# Patient Record
Sex: Female | Born: 1935 | Race: White | Hispanic: No | Smoking: Never smoker
Health system: Southern US, Community
[De-identification: ages and names within clinical notes are randomized; demographics above are authoritative.]

## PROBLEM LIST (undated history)

## (undated) DIAGNOSIS — J449 Chronic obstructive pulmonary disease, unspecified: Secondary | ICD-10-CM

## (undated) DIAGNOSIS — D649 Anemia, unspecified: Secondary | ICD-10-CM

## (undated) DIAGNOSIS — N289 Disorder of kidney and ureter, unspecified: Secondary | ICD-10-CM

## (undated) DIAGNOSIS — C259 Malignant neoplasm of pancreas, unspecified: Secondary | ICD-10-CM

## (undated) DIAGNOSIS — E119 Type 2 diabetes mellitus without complications: Secondary | ICD-10-CM

## (undated) DIAGNOSIS — K219 Gastro-esophageal reflux disease without esophagitis: Secondary | ICD-10-CM

## (undated) DIAGNOSIS — M1990 Unspecified osteoarthritis, unspecified site: Secondary | ICD-10-CM

## (undated) DIAGNOSIS — J45909 Unspecified asthma, uncomplicated: Secondary | ICD-10-CM

## (undated) DIAGNOSIS — I2510 Atherosclerotic heart disease of native coronary artery without angina pectoris: Secondary | ICD-10-CM

## (undated) DIAGNOSIS — I10 Essential (primary) hypertension: Secondary | ICD-10-CM

## (undated) DIAGNOSIS — N189 Chronic kidney disease, unspecified: Secondary | ICD-10-CM

## (undated) HISTORY — PX: CHOLECYSTECTOMY: SHX55

## (undated) HISTORY — PX: ABDOMINAL HYSTERECTOMY: SHX81

## (undated) HISTORY — PX: EYE SURGERY: SHX253

---

## 2004-04-04 ENCOUNTER — Emergency Department: Payer: Self-pay | Admitting: Emergency Medicine

## 2004-05-17 ENCOUNTER — Ambulatory Visit: Payer: Self-pay | Admitting: Gastroenterology

## 2004-10-24 ENCOUNTER — Ambulatory Visit: Payer: Self-pay | Admitting: Family Medicine

## 2006-06-24 ENCOUNTER — Inpatient Hospital Stay: Payer: Self-pay | Admitting: Internal Medicine

## 2007-09-06 ENCOUNTER — Ambulatory Visit: Payer: Self-pay | Admitting: Unknown Physician Specialty

## 2007-10-30 ENCOUNTER — Emergency Department: Payer: Self-pay | Admitting: Emergency Medicine

## 2008-01-29 ENCOUNTER — Ambulatory Visit: Payer: Self-pay | Admitting: Unknown Physician Specialty

## 2008-06-04 ENCOUNTER — Observation Stay: Payer: Self-pay | Admitting: Internal Medicine

## 2009-12-31 ENCOUNTER — Ambulatory Visit: Payer: Self-pay | Admitting: Family Medicine

## 2011-05-08 ENCOUNTER — Emergency Department: Payer: Self-pay | Admitting: *Deleted

## 2011-05-08 LAB — URINALYSIS, COMPLETE
Bilirubin,UR: NEGATIVE
Glucose,UR: NEGATIVE mg/dL (ref 0–75)
Hyaline Cast: 3
Ketone: NEGATIVE
Nitrite: NEGATIVE
Ph: 5 (ref 4.5–8.0)
Protein: NEGATIVE
RBC,UR: 4 /HPF (ref 0–5)
Specific Gravity: 1.015 (ref 1.003–1.030)
WBC UR: 8 /HPF (ref 0–5)

## 2011-05-08 LAB — COMPREHENSIVE METABOLIC PANEL
Albumin: 3.8 g/dL (ref 3.4–5.0)
Alkaline Phosphatase: 79 U/L (ref 50–136)
Anion Gap: 9 (ref 7–16)
BUN: 15 mg/dL (ref 7–18)
Bilirubin,Total: 0.2 mg/dL (ref 0.2–1.0)
EGFR (Non-African Amer.): 52 — ABNORMAL LOW
Glucose: 120 mg/dL — ABNORMAL HIGH (ref 65–99)
Osmolality: 280 (ref 275–301)
SGOT(AST): 23 U/L (ref 15–37)
SGPT (ALT): 21 U/L
Sodium: 139 mmol/L (ref 136–145)

## 2011-05-08 LAB — CBC WITH DIFFERENTIAL/PLATELET
Basophil #: 0.1 10*3/uL (ref 0.0–0.1)
Basophil %: 1.3 %
Eosinophil #: 0.4 10*3/uL (ref 0.0–0.7)
HCT: 32.1 % — ABNORMAL LOW (ref 35.0–47.0)
HGB: 10.4 g/dL — ABNORMAL LOW (ref 12.0–16.0)
MCH: 28.8 pg (ref 26.0–34.0)
MCHC: 32.4 g/dL (ref 32.0–36.0)
MCV: 89 fL (ref 80–100)
Monocyte %: 9.2 %
Neutrophil #: 4.6 10*3/uL (ref 1.4–6.5)
Neutrophil %: 58 %
RDW: 15 % — ABNORMAL HIGH (ref 11.5–14.5)

## 2012-01-02 ENCOUNTER — Emergency Department: Payer: Self-pay | Admitting: Emergency Medicine

## 2012-01-02 LAB — BASIC METABOLIC PANEL
Anion Gap: 8 (ref 7–16)
BUN: 16 mg/dL (ref 7–18)
Creatinine: 0.88 mg/dL (ref 0.60–1.30)
EGFR (African American): >60
EGFR (Non-African Amer.): >60
Glucose: 116 mg/dL — ABNORMAL HIGH (ref 65–99)

## 2012-01-02 LAB — CBC
MCH: 22.7 pg — ABNORMAL LOW (ref 26.0–34.0)
Platelet: 386 10*3/uL (ref 150–440)
WBC: 8.1 10*3/uL (ref 3.6–11.0)

## 2012-01-02 LAB — TROPONIN I: Troponin-I: <0.02 ng/mL

## 2012-02-15 ENCOUNTER — Ambulatory Visit: Payer: Self-pay | Admitting: Gastroenterology

## 2012-04-18 ENCOUNTER — Ambulatory Visit: Payer: Self-pay | Admitting: Gastroenterology

## 2012-06-16 ENCOUNTER — Emergency Department: Payer: Self-pay | Admitting: Emergency Medicine

## 2012-06-16 LAB — CBC
HCT: 25.5 % — ABNORMAL LOW (ref 35.0–47.0)
HGB: 8.2 g/dL — ABNORMAL LOW (ref 12.0–16.0)
MCH: 25.1 pg — ABNORMAL LOW (ref 26.0–34.0)
MCHC: 32 g/dL (ref 32.0–36.0)
MCV: 78 fL — ABNORMAL LOW (ref 80–100)
Platelet: 316 10*3/uL (ref 150–440)

## 2012-06-16 LAB — BASIC METABOLIC PANEL
Anion Gap: 7 (ref 7–16)
Calcium, Total: 8.7 mg/dL (ref 8.5–10.1)
Chloride: 103 mmol/L (ref 98–107)
Co2: 28 mmol/L (ref 21–32)
EGFR (African American): >60
Glucose: 151 mg/dL — ABNORMAL HIGH (ref 65–99)
Osmolality: 280 (ref 275–301)

## 2012-06-16 LAB — TROPONIN I: Troponin-I: <0.02 ng/mL

## 2012-06-16 LAB — CK TOTAL AND CKMB (NOT AT ARMC): CK, Total: 161 U/L (ref 21–215)

## 2013-02-25 ENCOUNTER — Emergency Department: Payer: Self-pay | Admitting: Emergency Medicine

## 2013-02-25 LAB — CBC WITH DIFFERENTIAL/PLATELET
BASOS PCT: 0.7 %
Basophil #: 0 10*3/uL (ref 0.0–0.1)
EOS PCT: 3.2 %
Eosinophil #: 0.2 10*3/uL (ref 0.0–0.7)
HCT: 34.5 % — ABNORMAL LOW (ref 35.0–47.0)
HGB: 11.6 g/dL — ABNORMAL LOW (ref 12.0–16.0)
Lymphocyte #: 1.2 10*3/uL (ref 1.0–3.6)
Lymphocyte %: 19.8 %
MCH: 30.2 pg (ref 26.0–34.0)
MCHC: 33.5 g/dL (ref 32.0–36.0)
MCV: 90 fL (ref 80–100)
MONOS PCT: 8.5 %
Monocyte #: 0.5 x10 3/mm (ref 0.2–0.9)
NEUTROS ABS: 4 10*3/uL (ref 1.4–6.5)
NEUTROS PCT: 67.8 %
PLATELETS: 285 10*3/uL (ref 150–440)
RBC: 3.84 10*6/uL (ref 3.80–5.20)
RDW: 15.5 % — ABNORMAL HIGH (ref 11.5–14.5)
WBC: 5.9 10*3/uL (ref 3.6–11.0)

## 2013-02-25 LAB — BASIC METABOLIC PANEL
Anion Gap: 8 (ref 7–16)
BUN: 15 mg/dL (ref 7–18)
CHLORIDE: 101 mmol/L (ref 98–107)
Calcium, Total: 8.5 mg/dL (ref 8.5–10.1)
Co2: 28 mmol/L (ref 21–32)
Creatinine: 1.32 mg/dL — ABNORMAL HIGH (ref 0.60–1.30)
EGFR (Non-African Amer.): 37 — ABNORMAL LOW
GFR CALC AF AMER: 43 — AB
GLUCOSE: 189 mg/dL — AB (ref 65–99)
OSMOLALITY: 280 (ref 275–301)
POTASSIUM: 3.8 mmol/L (ref 3.5–5.1)
SODIUM: 137 mmol/L (ref 136–145)

## 2013-02-25 LAB — TROPONIN I: Troponin-I: <0.02 ng/mL

## 2013-05-01 ENCOUNTER — Emergency Department: Payer: Self-pay | Admitting: Emergency Medicine

## 2013-05-01 LAB — CBC WITH DIFFERENTIAL/PLATELET
Basophil #: 0.1 10*3/uL (ref 0.0–0.1)
Basophil %: 0.9 %
EOS PCT: 2.7 %
Eosinophil #: 0.2 10*3/uL (ref 0.0–0.7)
HCT: 36.3 % (ref 35.0–47.0)
HGB: 11.4 g/dL — ABNORMAL LOW (ref 12.0–16.0)
Lymphocyte #: 1.4 10*3/uL (ref 1.0–3.6)
Lymphocyte %: 21.2 %
MCH: 29.7 pg (ref 26.0–34.0)
MCHC: 31.5 g/dL — ABNORMAL LOW (ref 32.0–36.0)
MCV: 94 fL (ref 80–100)
Monocyte #: 0.6 x10 3/mm (ref 0.2–0.9)
Monocyte %: 8.8 %
Neutrophil #: 4.5 10*3/uL (ref 1.4–6.5)
Neutrophil %: 66.4 %
PLATELETS: 327 10*3/uL (ref 150–440)
RBC: 3.85 10*6/uL (ref 3.80–5.20)
RDW: 15 % — ABNORMAL HIGH (ref 11.5–14.5)
WBC: 6.8 10*3/uL (ref 3.6–11.0)

## 2013-05-01 LAB — BASIC METABOLIC PANEL
Anion Gap: 9 (ref 7–16)
BUN: 24 mg/dL — AB (ref 7–18)
CHLORIDE: 105 mmol/L (ref 98–107)
CO2: 23 mmol/L (ref 21–32)
Calcium, Total: 8.4 mg/dL — ABNORMAL LOW (ref 8.5–10.1)
Creatinine: 1.57 mg/dL — ABNORMAL HIGH (ref 0.60–1.30)
EGFR (African American): 35 — ABNORMAL LOW
EGFR (Non-African Amer.): 30 — ABNORMAL LOW
GLUCOSE: 114 mg/dL — AB (ref 65–99)
OSMOLALITY: 279 (ref 275–301)
POTASSIUM: 3.7 mmol/L (ref 3.5–5.1)
SODIUM: 137 mmol/L (ref 136–145)

## 2013-05-01 LAB — PRO B NATRIURETIC PEPTIDE: B-TYPE NATIURETIC PEPTID: 379 pg/mL (ref 0–450)

## 2013-05-01 LAB — TROPONIN I: Troponin-I: <0.02 ng/mL

## 2013-05-02 LAB — URINALYSIS, COMPLETE
Bilirubin,UR: NEGATIVE
Blood: NEGATIVE
Glucose,UR: NEGATIVE mg/dL (ref 0–75)
KETONE: NEGATIVE
Nitrite: NEGATIVE
Ph: 5 (ref 4.5–8.0)
Protein: NEGATIVE
RBC,UR: 3 /HPF (ref 0–5)
Specific Gravity: 1.014 (ref 1.003–1.030)
WBC UR: 5 /HPF (ref 0–5)

## 2013-09-18 ENCOUNTER — Observation Stay: Payer: Self-pay | Admitting: Internal Medicine

## 2013-09-18 LAB — RENAL FUNCTION PANEL
Albumin: 2.6 g/dL — ABNORMAL LOW (ref 3.4–5.0)
Anion Gap: 8 (ref 7–16)
BUN: 18 mg/dL (ref 7–18)
CALCIUM: 7.6 mg/dL — AB (ref 8.5–10.1)
Chloride: 110 mmol/L — ABNORMAL HIGH (ref 98–107)
Co2: 24 mmol/L (ref 21–32)
Creatinine: 1.67 mg/dL — ABNORMAL HIGH (ref 0.60–1.30)
GFR CALC AF AMER: 32 — AB
GFR CALC NON AF AMER: 28 — AB
Glucose: 102 mg/dL — ABNORMAL HIGH (ref 65–99)
Osmolality: 285 (ref 275–301)
POTASSIUM: 3.4 mmol/L — AB (ref 3.5–5.1)
Phosphorus: 4 mg/dL (ref 2.5–4.9)
Sodium: 142 mmol/L (ref 136–145)

## 2013-09-18 LAB — CBC WITH DIFFERENTIAL/PLATELET
Basophil #: 0.1 10*3/uL (ref 0.0–0.1)
Basophil #: 0.1 10*3/uL (ref 0.0–0.1)
Basophil %: 0.7 %
Basophil %: 0.9 %
EOS ABS: 0.3 10*3/uL (ref 0.0–0.7)
Eosinophil #: 0.2 10*3/uL (ref 0.0–0.7)
Eosinophil %: 3.3 %
Eosinophil %: 3.3 %
HCT: 28.1 % — AB (ref 35.0–47.0)
HCT: 28.4 % — ABNORMAL LOW (ref 35.0–47.0)
HGB: 9.1 g/dL — AB (ref 12.0–16.0)
HGB: 9.1 g/dL — AB (ref 12.0–16.0)
LYMPHS ABS: 1.3 10*3/uL (ref 1.0–3.6)
LYMPHS PCT: 18.9 %
Lymphocyte #: 1.1 10*3/uL (ref 1.0–3.6)
Lymphocyte %: 12.6 %
MCH: 30.5 pg (ref 26.0–34.0)
MCH: 30.3 pg (ref 26.0–34.0)
MCHC: 32.2 g/dL (ref 32.0–36.0)
MCHC: 32 g/dL (ref 32.0–36.0)
MCV: 95 fL (ref 80–100)
MCV: 95 fL (ref 80–100)
MONOS PCT: 8.9 %
Monocyte #: 0.8 x10 3/mm (ref 0.2–0.9)
Monocyte #: 0.6 x10 3/mm (ref 0.2–0.9)
Monocyte %: 9.3 %
NEUTROS ABS: 6.7 10*3/uL — AB (ref 1.4–6.5)
Neutrophil #: 4.6 10*3/uL (ref 1.4–6.5)
Neutrophil %: 74.5 %
Neutrophil %: 67.6 %
PLATELETS: 280 10*3/uL (ref 150–440)
PLATELETS: 303 10*3/uL (ref 150–440)
RBC: 2.97 10*6/uL — ABNORMAL LOW (ref 3.80–5.20)
RBC: 3 10*6/uL — AB (ref 3.80–5.20)
RDW: 14 % (ref 11.5–14.5)
RDW: 13.8 % (ref 11.5–14.5)
WBC: 9.1 10*3/uL (ref 3.6–11.0)
WBC: 6.8 10*3/uL (ref 3.6–11.0)

## 2013-09-18 LAB — CBC
HCT: 28.6 % — ABNORMAL LOW (ref 35.0–47.0)
HGB: 9.2 g/dL — ABNORMAL LOW (ref 12.0–16.0)
MCH: 30.8 pg (ref 26.0–34.0)
MCHC: 32.4 g/dL (ref 32.0–36.0)
MCV: 95 fL (ref 80–100)
Platelet: 303 10*3/uL (ref 150–440)
RBC: 3 10*6/uL — AB (ref 3.80–5.20)
RDW: 14.2 % (ref 11.5–14.5)
WBC: 7.1 10*3/uL (ref 3.6–11.0)

## 2013-09-18 LAB — URINALYSIS, COMPLETE
BLOOD: NEGATIVE
Bacteria: NONE SEEN
Bilirubin,UR: NEGATIVE
Glucose,UR: NEGATIVE mg/dL (ref 0–75)
KETONE: NEGATIVE
Leukocyte Esterase: NEGATIVE
NITRITE: NEGATIVE
PH: 5 (ref 4.5–8.0)
Protein: NEGATIVE
RBC,UR: NONE SEEN /HPF (ref 0–5)
SPECIFIC GRAVITY: 1.008 (ref 1.003–1.030)
SQUAMOUS EPITHELIAL: NONE SEEN

## 2013-09-18 LAB — COMPREHENSIVE METABOLIC PANEL
ALBUMIN: 3.1 g/dL — AB (ref 3.4–5.0)
ALK PHOS: 120 U/L — AB
Anion Gap: 6 — ABNORMAL LOW (ref 7–16)
BUN: 22 mg/dL — ABNORMAL HIGH (ref 7–18)
Bilirubin,Total: 0.5 mg/dL (ref 0.2–1.0)
Calcium, Total: 7.9 mg/dL — ABNORMAL LOW (ref 8.5–10.1)
Chloride: 108 mmol/L — ABNORMAL HIGH (ref 98–107)
Co2: 26 mmol/L (ref 21–32)
Creatinine: 2 mg/dL — ABNORMAL HIGH (ref 0.60–1.30)
EGFR (Non-African Amer.): 22 — ABNORMAL LOW
GFR CALC AF AMER: 26 — AB
Glucose: 101 mg/dL — ABNORMAL HIGH (ref 65–99)
Osmolality: 283 (ref 275–301)
Potassium: 3.5 mmol/L (ref 3.5–5.1)
SGOT(AST): 27 U/L (ref 15–37)
SGPT (ALT): 33 U/L
SODIUM: 140 mmol/L (ref 136–145)
Total Protein: 6.6 g/dL (ref 6.4–8.2)

## 2013-09-18 LAB — CLOSTRIDIUM DIFFICILE(ARMC)

## 2013-09-18 LAB — PROTIME-INR
INR: 1
Prothrombin Time: 13.1 secs (ref 11.5–14.7)

## 2013-09-18 LAB — TROPONIN I: Troponin-I: <0.02 ng/mL

## 2013-09-19 LAB — BASIC METABOLIC PANEL
Anion Gap: 9 (ref 7–16)
BUN: 14 mg/dL (ref 7–18)
CALCIUM: 7.7 mg/dL — AB (ref 8.5–10.1)
CHLORIDE: 112 mmol/L — AB (ref 98–107)
Co2: 24 mmol/L (ref 21–32)
Creatinine: 1.54 mg/dL — ABNORMAL HIGH (ref 0.60–1.30)
EGFR (African American): 35 — ABNORMAL LOW
EGFR (Non-African Amer.): 30 — ABNORMAL LOW
Glucose: 84 mg/dL (ref 65–99)
Osmolality: 288 (ref 275–301)
Potassium: 3.2 mmol/L — ABNORMAL LOW (ref 3.5–5.1)
Sodium: 145 mmol/L (ref 136–145)

## 2013-09-19 LAB — HEMOGLOBIN: HGB: 9.2 g/dL — ABNORMAL LOW (ref 12.0–16.0)

## 2013-12-12 ENCOUNTER — Emergency Department: Payer: Self-pay | Admitting: Emergency Medicine

## 2013-12-12 LAB — BASIC METABOLIC PANEL
Anion Gap: 9 (ref 7–16)
BUN: 22 mg/dL — ABNORMAL HIGH (ref 7–18)
CALCIUM: 7.4 mg/dL — AB (ref 8.5–10.1)
CREATININE: 1.76 mg/dL — AB (ref 0.60–1.30)
Chloride: 107 mmol/L (ref 98–107)
Co2: 26 mmol/L (ref 21–32)
EGFR (African American): 35 — ABNORMAL LOW
GFR CALC NON AF AMER: 29 — AB
Glucose: 182 mg/dL — ABNORMAL HIGH (ref 65–99)
OSMOLALITY: 291 (ref 275–301)
Potassium: 4.1 mmol/L (ref 3.5–5.1)
Sodium: 142 mmol/L (ref 136–145)

## 2013-12-12 LAB — TROPONIN I
Troponin-I: <0.02 ng/mL
Troponin-I: <0.02 ng/mL

## 2013-12-12 LAB — CBC
HCT: 29.2 % — ABNORMAL LOW (ref 35.0–47.0)
HGB: 9.4 g/dL — ABNORMAL LOW (ref 12.0–16.0)
MCH: 30.6 pg (ref 26.0–34.0)
MCHC: 32.2 g/dL (ref 32.0–36.0)
MCV: 95 fL (ref 80–100)
PLATELETS: 215 10*3/uL (ref 150–440)
RBC: 3.07 10*6/uL — AB (ref 3.80–5.20)
RDW: 13.3 % (ref 11.5–14.5)
WBC: 9.8 10*3/uL (ref 3.6–11.0)

## 2013-12-12 LAB — URINALYSIS, COMPLETE
BACTERIA: NONE SEEN
Bilirubin,UR: NEGATIVE
Blood: NEGATIVE
GLUCOSE, UR: NEGATIVE mg/dL (ref 0–75)
KETONE: NEGATIVE
Leukocyte Esterase: NEGATIVE
Nitrite: NEGATIVE
PH: 5 (ref 4.5–8.0)
PROTEIN: NEGATIVE
RBC, UR: NONE SEEN /HPF (ref 0–5)
Specific Gravity: 1.006 (ref 1.003–1.030)
Squamous Epithelial: <1
WBC UR: 5 /HPF (ref 0–5)

## 2014-01-19 DIAGNOSIS — R079 Chest pain, unspecified: Secondary | ICD-10-CM | POA: Diagnosis not present

## 2014-01-26 DIAGNOSIS — E538 Deficiency of other specified B group vitamins: Secondary | ICD-10-CM | POA: Diagnosis not present

## 2014-02-04 DIAGNOSIS — E780 Pure hypercholesterolemia: Secondary | ICD-10-CM | POA: Diagnosis not present

## 2014-02-04 DIAGNOSIS — I2510 Atherosclerotic heart disease of native coronary artery without angina pectoris: Secondary | ICD-10-CM | POA: Diagnosis not present

## 2014-02-04 DIAGNOSIS — E1122 Type 2 diabetes mellitus with diabetic chronic kidney disease: Secondary | ICD-10-CM | POA: Diagnosis not present

## 2014-02-04 DIAGNOSIS — I10 Essential (primary) hypertension: Secondary | ICD-10-CM | POA: Diagnosis not present

## 2014-02-12 DIAGNOSIS — E1122 Type 2 diabetes mellitus with diabetic chronic kidney disease: Secondary | ICD-10-CM | POA: Diagnosis not present

## 2014-02-12 DIAGNOSIS — E780 Pure hypercholesterolemia: Secondary | ICD-10-CM | POA: Diagnosis not present

## 2014-02-12 DIAGNOSIS — K219 Gastro-esophageal reflux disease without esophagitis: Secondary | ICD-10-CM | POA: Diagnosis not present

## 2014-02-12 DIAGNOSIS — I10 Essential (primary) hypertension: Secondary | ICD-10-CM | POA: Diagnosis not present

## 2014-02-13 DIAGNOSIS — E1122 Type 2 diabetes mellitus with diabetic chronic kidney disease: Secondary | ICD-10-CM | POA: Diagnosis not present

## 2014-02-13 DIAGNOSIS — D649 Anemia, unspecified: Secondary | ICD-10-CM | POA: Diagnosis not present

## 2014-02-13 DIAGNOSIS — E780 Pure hypercholesterolemia: Secondary | ICD-10-CM | POA: Diagnosis not present

## 2014-02-13 DIAGNOSIS — N183 Chronic kidney disease, stage 3 (moderate): Secondary | ICD-10-CM | POA: Diagnosis not present

## 2014-02-17 DIAGNOSIS — E1122 Type 2 diabetes mellitus with diabetic chronic kidney disease: Secondary | ICD-10-CM | POA: Diagnosis not present

## 2014-02-17 DIAGNOSIS — N183 Chronic kidney disease, stage 3 (moderate): Secondary | ICD-10-CM | POA: Diagnosis not present

## 2014-02-17 DIAGNOSIS — D631 Anemia in chronic kidney disease: Secondary | ICD-10-CM | POA: Diagnosis not present

## 2014-02-17 DIAGNOSIS — I129 Hypertensive chronic kidney disease with stage 1 through stage 4 chronic kidney disease, or unspecified chronic kidney disease: Secondary | ICD-10-CM | POA: Diagnosis not present

## 2014-02-17 DIAGNOSIS — N2581 Secondary hyperparathyroidism of renal origin: Secondary | ICD-10-CM | POA: Diagnosis not present

## 2014-02-26 DIAGNOSIS — E538 Deficiency of other specified B group vitamins: Secondary | ICD-10-CM | POA: Diagnosis not present

## 2014-03-04 DIAGNOSIS — K529 Noninfective gastroenteritis and colitis, unspecified: Secondary | ICD-10-CM | POA: Diagnosis not present

## 2014-03-25 ENCOUNTER — Emergency Department: Payer: Self-pay | Admitting: Emergency Medicine

## 2014-03-25 DIAGNOSIS — R55 Syncope and collapse: Secondary | ICD-10-CM | POA: Diagnosis not present

## 2014-03-25 DIAGNOSIS — J441 Chronic obstructive pulmonary disease with (acute) exacerbation: Secondary | ICD-10-CM | POA: Diagnosis not present

## 2014-03-25 DIAGNOSIS — R531 Weakness: Secondary | ICD-10-CM | POA: Diagnosis not present

## 2014-03-25 DIAGNOSIS — R110 Nausea: Secondary | ICD-10-CM | POA: Diagnosis not present

## 2014-03-25 DIAGNOSIS — E119 Type 2 diabetes mellitus without complications: Secondary | ICD-10-CM | POA: Diagnosis not present

## 2014-03-25 DIAGNOSIS — Z79899 Other long term (current) drug therapy: Secondary | ICD-10-CM | POA: Diagnosis not present

## 2014-03-25 DIAGNOSIS — R197 Diarrhea, unspecified: Secondary | ICD-10-CM | POA: Diagnosis not present

## 2014-03-25 DIAGNOSIS — I10 Essential (primary) hypertension: Secondary | ICD-10-CM | POA: Diagnosis not present

## 2014-03-25 DIAGNOSIS — R42 Dizziness and giddiness: Secondary | ICD-10-CM | POA: Diagnosis not present

## 2014-03-26 DIAGNOSIS — J441 Chronic obstructive pulmonary disease with (acute) exacerbation: Secondary | ICD-10-CM | POA: Diagnosis not present

## 2014-03-26 DIAGNOSIS — R55 Syncope and collapse: Secondary | ICD-10-CM | POA: Diagnosis not present

## 2014-03-26 DIAGNOSIS — R42 Dizziness and giddiness: Secondary | ICD-10-CM | POA: Diagnosis not present

## 2014-03-26 DIAGNOSIS — Z79899 Other long term (current) drug therapy: Secondary | ICD-10-CM | POA: Diagnosis not present

## 2014-03-26 DIAGNOSIS — E119 Type 2 diabetes mellitus without complications: Secondary | ICD-10-CM | POA: Diagnosis not present

## 2014-03-26 DIAGNOSIS — R531 Weakness: Secondary | ICD-10-CM | POA: Diagnosis not present

## 2014-03-26 DIAGNOSIS — R110 Nausea: Secondary | ICD-10-CM | POA: Diagnosis not present

## 2014-03-26 DIAGNOSIS — R197 Diarrhea, unspecified: Secondary | ICD-10-CM | POA: Diagnosis not present

## 2014-03-26 DIAGNOSIS — I10 Essential (primary) hypertension: Secondary | ICD-10-CM | POA: Diagnosis not present

## 2014-03-31 DIAGNOSIS — E538 Deficiency of other specified B group vitamins: Secondary | ICD-10-CM | POA: Diagnosis not present

## 2014-04-02 ENCOUNTER — Ambulatory Visit: Payer: Self-pay | Admitting: Gastroenterology

## 2014-04-02 DIAGNOSIS — K529 Noninfective gastroenteritis and colitis, unspecified: Secondary | ICD-10-CM | POA: Diagnosis not present

## 2014-04-02 DIAGNOSIS — D124 Benign neoplasm of descending colon: Secondary | ICD-10-CM | POA: Diagnosis not present

## 2014-04-02 DIAGNOSIS — J449 Chronic obstructive pulmonary disease, unspecified: Secondary | ICD-10-CM | POA: Diagnosis not present

## 2014-04-02 DIAGNOSIS — R197 Diarrhea, unspecified: Secondary | ICD-10-CM | POA: Diagnosis not present

## 2014-04-02 DIAGNOSIS — E119 Type 2 diabetes mellitus without complications: Secondary | ICD-10-CM | POA: Diagnosis not present

## 2014-04-02 DIAGNOSIS — Z7982 Long term (current) use of aspirin: Secondary | ICD-10-CM | POA: Diagnosis not present

## 2014-04-02 DIAGNOSIS — D123 Benign neoplasm of transverse colon: Secondary | ICD-10-CM | POA: Diagnosis not present

## 2014-04-02 DIAGNOSIS — D122 Benign neoplasm of ascending colon: Secondary | ICD-10-CM | POA: Diagnosis not present

## 2014-04-02 DIAGNOSIS — D126 Benign neoplasm of colon, unspecified: Secondary | ICD-10-CM | POA: Diagnosis not present

## 2014-04-02 DIAGNOSIS — I10 Essential (primary) hypertension: Secondary | ICD-10-CM | POA: Diagnosis not present

## 2014-04-02 DIAGNOSIS — K635 Polyp of colon: Secondary | ICD-10-CM | POA: Diagnosis not present

## 2014-04-28 DIAGNOSIS — E538 Deficiency of other specified B group vitamins: Secondary | ICD-10-CM | POA: Diagnosis not present

## 2014-05-02 NOTE — Consult Note (Signed)
Pt seen and examined. Please see C. London's notes. Pt does have hx of diverticulosis and polyps. Bleeding has stopped. Chronic diarrhea. More GERD sxs. Agree with increasing PPI to bid. Consider more fiber in the diet. Consider probiotics. If diarrhea persists, consider colestid once or twice daily. Will follow. Hopefully, patient can be discharged soon. thanks  Electronic Signatures: Verdie Shire (MD)  (Signed on 10-Sep-15 15:30)  Authored  Last Updated: 10-Sep-15 15:30 by Verdie Shire (MD)

## 2014-05-02 NOTE — H&P (Signed)
PATIENT NAME:  Lisa Beard, Lisa Beard MR#:  [id number] DATE OF BIRTH:  03-28-1926  DATE OF ADMISSION:  09/18/2013  REFERRING PHYSICIAN: Dr. Lurline Hare.     CHIEF COMPLAINT: GI bleed.  HISTORY OF PRESENT ILLNESS: This is an 81 year old Caucasian female with history of COPD, coronary artery disease, type 2 diabetes non-insulin-requiring, hypertension and irritable bowel syndrome with past medical history of diverticulosis presenting with bright red blood per rectum. Describes a 3 month duration of diarrhea for which she has followed with her PCP believed to be secondary to her irritable bowel syndrome. She now develops some bright red blood per rectum, over the last two day duration. She describes this mainly as blood mixed in the stools, no frank blood, however blood is described as bright red. She also complains of mild abdominal pain.  Unable to further quantify pain other than "pain, nonradiating". Umbilical in location no worsening or relieving factors. Two to 3 out of 10 in intensity. No nausea or vomiting.   REVIEW OF SYSTEMS:   CONSTITUTIONAL: Denies fever, fatigue, weakness.  EYES: Blurred or double denies eye pain.  EARS, NOSE, THROAT: Denies tinnitus, ear pain, hearing loss.  RESPIRATORY: Denies cough, wheeze, shortness of breath.  CARDIOVASCULAR: Denies chest pain, palpitations. Positive for edema.  GASTROINTESTINAL: Denies nausea or vomiting. Positive for abdominal pain and bright red blood per rectum.  GENITOURINARY: Denies dysuria, hematuria.  ENDOCRINE: Denies nocturia or thyroid problems. HEMATOLOGIC AND LYMPHATICS: Denies easy bruising, bleeding.  SKIN: Denies rash or lesion.  MUSCULOSKELETAL: Denies pain in neck, back, shoulder, knees, hips or arthritic symptoms. NEUROLOGIC: Denies paralysis or paresthesia.  PSYCHIATRIC: Denies anxiety or depressive symptoms. Otherwise, full review of systems performed by me is negative.   PAST MEDICAL HISTORY: Irritable bowel syndrome,  diverticulosis, COPD, non-O2 dependent, type 2 diabetes, coronary artery disease, hypertension.   SOCIAL HISTORY: Denies current alcohol, tobacco, or drug use. Uses a cane for ambulation.   FAMILY HISTORY: Positive for GERD. Otherwise, no further gastrointestinal pathology.   ALLERGIES: No known drug allergies.   HOME MEDICATIONS: Include aspirin 81 mg p.o. daily, trazodone 50 mg p.o. at bedtime., Januvia 50 mg p.o. q. daily, lovastatin 20 mg p.o. at bedtime, metoprolol 25 mg p.o. daily, Advair 250/50 mcg inhalation 1 puff b.i.d., Combivent 100/20 mcg inhalation 1 puff 4 times daily as needed for shortness of breath, Lasix 20 mg p.o. q. daily, hydrochlorothiazide 25 mg p.o. q. daily, and ferrous sulfate 325 mg p.o. daily, pantoprazole 40 mg p.o. q. daily, vitamin D3 1000 international units p.o. q. daily.   PHYSICAL EXAMINATION:  VITAL SIGNS: Temperature 98.8, heart rate 81, respirations 20, blood pressure 224/97, currently 178/83, saturating 99% on room air. Weight 76.7 kg, BMI 30.9 kg.  GENERAL: Well-nourished, well-developed, Caucasian female, currently in no acute distress.  HEAD: Normocephalic, atraumatic.  EYES: Pupils are equal, round and reactive to light. Extraocular muscles are intact. No scleral icterus.  MOUTH: Moist mucous membranes. Dentition intact. No abscess.  EARS, NOSE AND THROAT: Clear without exudates. No external lesions.  NECK: Supple. No thyromegaly. No nodules. No JVD.  PULMONARY: Clear to auscultation bilaterally without wheezes, rales or rhonchi. No use of accessory muscle, good respiratory effort.  CHEST: Nontender to palpation.  CARDIOVASCULAR: S1, S2. Regular rate and rhythm. No murmurs, rubs, or gallops. One to 2+ edema to knees bilaterally. Pedal pulses 2+ bilaterally.  GASTROINTESTINAL: Soft, nondistended, nontender to palpation. Positive bowel sounds.  Abdomen has no hepatosplenomegaly.  MUSCULOSKELETAL: No swelling, clubbing. Positive for edema as described  above. Range of motion full in all extremities.  NEUROLOGIC: Cranial nerves II through XII intact. No gross focal neurologic deficits. Sensation is intact.  SKIN: No ulceration, lesions, rash or cyanosis. Skin warm and dry. Turgor intact.  PSYCHIATRIC: Mood and affect within normal limits. Alert and oriented x 3. Insight and judgment intact.   LABORATORY DATA: Sodium 140, potassium 3.5, chloride 108, bicarbonate 26, BUN 22, creatinine 2.0, glucose 101. LFTs: Albumin 3.1, alkaline phosphatase 120, otherwise within normal limits. WBC 7.1, hemoglobin 9.2, platelets of 303,000.   ASSESSMENT AND PLAN:  An 81 year old Caucasian female with history of irritable bowel syndrome and type 2 diabetes presenting with bright red blood per rectum with 2 day duration.   1. GI bleed, type and cross. Trend CBCs q. 6 hours. Transfusion threshold hemoglobin less than 7. We will consult gastroenterology and place n.p.o. for likely colonoscopy.  2. Acute kidney injury on chronic kidney disease. Hold nephrotoxic agents including diuretics and provide gentle intravenous fluid hydration. 3. Type 2 diabetes, non-insulin-dependent, hold p.o. agents. Add insulin sliding scale with q. 6 hour Accu-Cheks.  4. Hypertension. Restart home medications with the addition of p.r.n. hydralazine.  5. Deep venous thrombosis prophylaxis with sequential compression devices.   TOTAL TIME SPENT: 45 minutes.    ____________________________ Aaron Mose. Faiz Weber, MD dkh:JT D: 09/18/2013 01:40:44 ET T: 09/18/2013 03:07:00 ET JOB#: [id number]  cc: Aaron Mose. Lashana Spang, MD, <Dictator> Hosey Burmester Woodfin Ganja MD ELECTRONICALLY SIGNED 09/19/2013 2:02

## 2014-05-02 NOTE — Consult Note (Signed)
PATIENT NAME:  Lisa Beard, NEEDLES MR#:  [id number] DATE OF BIRTH:  1926/07/13  DATE OF CONSULTATION:  09/18/2013  REFERRING PHYSICIAN:  Aaron Mose. Hower, MD  CONSULTING PHYSICIAN:  Theodore Demark, NP  REASON FOR CONSULTATION: GI consultation ordered by Dr. Lavetta Nielsen for evaluation of GI bleed or diverticulitis.   HISTORY OF PRESENT ILLNESS: I appreciate consult for this pleasant 81 year old Caucasian woman with history of COPD, IBS, hypertension, type 2 diabetes, CAD, for evaluation of GI bleeding. Reports a several week history of loose stools, increasing evening GERD symptoms, and mild abdominal pain across the umbilicus. States about 6 loose stools a day, usually brownish-yellow. Did notice some black stools after starting iron therapy, but these have stopped and not returned; this was several weeks ago. Bowel movements associated with urgency and some fecal incontinence triggered by food.  Has not seen any blood in the stool until yesterday, saw some varying amounts of bright red blood in association with bowel movements, which she has not seen today. States also some increasing dyspepsia over the course of the day. Has had burning to the chest in the evenings when she lays down, usually triggered by bending over, and burping helps it feel better. She states she is not taking any non-steroidal anti-inflammatory drugs, has rare nausea, no vomiting or dysphagia. Unsure if any weight loss. Appetite variable. Unsure if she is on proton pump inhibitor therapy or not. Has had 4 colonoscopies since 2002. Findings included internal hemorrhoids, a couple of adenomatous polyps, and diverticula. Last colonoscopy was 2014, by Dr. Candace Cruise.  There was a small adenoma removed. Random biopsy was negative for abnormal pathology to the colonic mucosa. Had EGD 2014 as well with LA grade A esophagitis, a benign-appearing esophageal stricture, and a hiatal hernia. Hemoglobin here has been very stable. No problems with white blood  cells. One stool today so far without blood. Afebrile. C. difficile was negative.   REVIEW OF SYSTEMS: Ten systems reviewed. Significant for fatigue, weakness, chronic shortness of breath. Otherwise, unremarkable other than what is noted above.   PAST MEDICAL HISTORY: IBS, diverticulosis, COPD, type 2 diabetes, hypertension, hyperlipidemia, coronary artery disease.    PAST SURGICAL HISTORY:   None.    SOCIAL HISTORY: No alcohol, tobacco, or drugs. Uses a cane for ambulation.   FAMILY HISTORY: No colorectal cancer, colon polyps, liver disease, or ulcers.   ALLERGIES: NKDA.   HOME MEDICATIONS: ASA 81 mg p.o. daily, trazodone 50 mg p.o. at bedtime, Januvia 50 mg p.o. daily, lovastatin 20 mg p.o. at bedtime, metoprolol 25 mg p.o. daily, Advair 250/50 one puff b.i.d., Combivent 100/200 mcg inhalation 1 puff q.i.d. p.r.n., Lasix 20 mg p.o. daily, HCTZ 25 mg p.o. daily, ferrous sulfate 325 mg p.o. daily, pantoprazole 40 mg p.o. daily, vitamin D3 1000 International Units p.o. daily.   LABORATORY DATA:  Most recent labs: Glucose 102, BUN 18, creatinine 1.67, sodium 142, potassium 3.4, GFR 28, calcium 7.6, phosphorus 4.0. WBC 6.8, hemoglobin 9.1, hematocrit 28.4, normal platelets. Red blood cells normocytic. C. difficile is negative. ALT 120, otherwise her liver panel is normal. PT/INR has been normal in further chart review.   PHYSICAL EXAMINATION: VITAL SIGNS: Most recent: Temperature 98.3, pulse 103, respiratory rate 20, blood pressure 162/79, SAO2 97% on room air.  GENERAL: Well-appearing Caucasian woman lying in bed in no acute distress.  HEENT: Normocephalic, atraumatic. Sclerae clear. Conjunctivae pink.  NECK: Supple. No thyromegaly or JVD.  CHEST: Respirations eupneic. Lungs clear bilaterally.  CARDIAC: S1, S2, Regular  rate and rhythm. No murmurs, rubs, or gallops. No significant edema.  ABDOMEN: Bowel sounds x4, slightly protuberant, soft, nondistended, minimal tenderness across the abdomen  diffusely.  RECTAL: No apparent external abnormalities. Stool tan, hemoccult negative. No internal abnormalities. Nontender.  SKIN: Warm, dry, pale pink. No erythema, lesion, or rash.  NEUROLOGICAL: Alert, oriented x3. Speech clear. No facial droop.  PSYCHIATRIC: Pleasant, calm, cooperative.   IMPRESSION AND PLAN: 1.  BRPR:  likely  anal outlet in relation to increased stools from her irritable bowel syndrome. This seems resolved. No further signs of bleeding. If she has significant bleeding episode, would get a stat bleeding scan. We will try to improve her irritable bowel syndrome to lessen the chance of rectal irritation.  2.  Gastroesophageal reflux disease.  Would increase her pantoprazole to 40 mg p.o. b.i.d. for now. Further recommendations to follow for both.   Thank you very much for this consult.   These services were provided by Stephens November, MSN, [facility], in collaboration with Dr. Verdie Shire, with whom I have discussed this patient in full   ____________________________ Theodore Demark, NP chl:lr D: 09/18/2013 17:46:50 ET T: 09/18/2013 18:50:48 ET JOB#: [id number]  cc: Theodore Demark, NP, <Dictator> [place] SIGNED 09/19/2013 8:50

## 2014-05-02 NOTE — Discharge Summary (Signed)
PATIENT NAME:  Lisa Beard, Lisa Beard MR#:  [id number] DATE OF BIRTH:  07-17-1926  DATE OF ADMISSION:  09/18/2013 DATE OF DISCHARGE:  09/19/2013  PRIMARY CARE PHYSICIAN:  Thereasa Distance, MD   GASTROENTEROLOGIST:  Verdie Shire, MD    FINAL DIAGNOSES: 1.  Chronic diarrhea, abdominal pain, blood loss anemia.  2.  Accelerated hypertension.  3.  Hyperlipidemia.  4.  Diabetes.  5.  Gastroesophageal reflux disease.   MEDICATIONS ON DISCHARGE: Include Combivent CFC 1 puff 4 times a day as needed for shortness of breath, lovastatin 20 mg at bedtime, Advair Diskus 250/50 one inhalation twice a day, vitamin D 1000 International Units daily, Januvia 50 mg daily, Protonix 40 mg daily, metoprolol extended release 25 mg daily, trazodone 50 mg at bedtime, ferrous sulfate increased to 325 mg twice a day, amlodipine 2.5 mg daily, dicyclomine 10 mg 4 times a day as needed for abdominal cramping, acidophilus 1 tablet twice a day for 14 days. Stop hydrochlorothiazide, aspirin, and furosemide.   FOLLOWUP: With Dr. Candace Cruise, gastroenterology, and Dr. Ellison Hughs in 1 to 2 weeks.   DIET: Regular consistency, low-sodium, 1800 ADA; stop milk products.   The patient came in with bloody bowel movements, suspected gastrointestinal bleed. The patient was admitted as an observation, kept n.p.o. for acute on chronic kidney disease. The patient was given IV fluids.   LABORATORY AND RADIOLOGICAL DATA: During the hospital course, troponin negative. INR 1.0. White blood cell count 10.1, hemoglobin and hematocrit 9.2 and 28.6, platelet count of 303,000. Glucose 101, BUN 22, creatinine 2.0, sodium 140, potassium 3.5, chloride 108, CO2 of 26, calcium 7.9. Liver function tests: Alkaline phosphatase 120, ALT 33, AST 27. Urinalysis negative. Hemoglobin remained stable at 9.2. Stool for C. difficile was negative.   HOSPITAL COURSE PER PROBLEM LIST:   1.  For the patient's chronic diarrhea, this has been going on for months, follows up with Dr. Candace Cruise as  outpatient. I ordered other stool studies, but unfortunately none was sent to the laboratory except for the stool for C. difficile, which was negative. Would check ova and parasites and comprehensive panel as outpatient. Her abdominal pain had lessened. The patient was advanced on her diet and was sent home. Blood loss was not that much. Hemoglobin remained stable. To help combat her chronic diarrhea, I did start Norvasc for hypertension. A side effect of that is constipation and I increased her iron. I did start Bentyl for abdominal cramping just in case this is irritable bowel. I asked her to stop milk products.  2.  Accelerated hypertension. She is on Toprol and recently stopped on her hydrochlorothiazide. I added Norvasc.  3.  Diabetes, on Januvia.  4.  Gastroesophageal reflux disease, on Protonix.  5.  Anemia, on ferrous sulfate.  6.  Hyperlipidemia, on lovastatin.  7.  Acute on chronic kidney disease, stage III. Follows up with nephrology as outpatient. Has appointment already with nephrologist as outpatient. Creatinine had improved to 1.54, which is baseline for the patient on discharge.   TIME SPENT ON DISCHARGE: Thirty-five minutes.   ____________________________ Tana Conch. Leslye Peer, MD rjw:lr D: 09/19/2013 15:46:00 ET T: 09/19/2013 16:11:30 ET JOB#: [id number]  cc: Tana Conch. Leslye Peer, MD, <Dictator> Sofie Hartigan, MD Lupita Dawn. Candace Cruise, MD Marisue Brooklyn MD ELECTRONICALLY SIGNED 09/23/2013 12:40

## 2014-05-02 NOTE — Consult Note (Signed)
Brief Consult Note: Diagnosis: brpr.   Patient was seen by consultant.   Consult note dictated.   Comments: Appreciate consult for pleasant 81 y/o caucasian woman with history of COPD, IBS, htn,t2dm, CAD for evaluation of GI bleeding, Reports a several week hx loose stools, increasing evening GERD symptoms, and  mild abdominal pain across the umbilicus. States about 6 loose stools/d- usually brownish yellow. Did notice some black stools after starting Fe, but these have stopped and not returned: this was several weeks ago. Bowel movements assoc w/ urgency and some fecal incontinence, triggered by food.  Has not seen any blood in the stool until yesterday: saw some varying amts of bright red blood in association with bowel movements.  States also some increasing dyspepsia over the day- has some burning to the chest in the evenings when she lays down. Usually triggered by bending over and burping helps it feel better. States she is not taking any NSAIDs. Rare nausea. No vomiting or dysphagia. Unsure if any weight loss. Appetite variable. Unsure if she is on PPI or not. Has had 4 colonoscopies since 2002: findings included internal hemorrhoids, a couple of adenomatous polyps, diverticula. Last was 2014 by Dr Candace Cruise- there was a small adenoma removed.Random biopsy negative for abnormal pathology. Had EGD 2014 as well with La Grade A esophagitis, a benign appearing stricture, and a hiatal hernia. Hgb here very stable. No problems with wbc. One stool today so far, without blood. Afebrile. C-diff negative. On exam, abd protuberant/soft, minimal tenderness diffusely. No rebound or peritoneal signs. Rectal exam with tan loose stool. No obvious abnormalities. Heme negative. Impression/Plan: Brpr- likely was anal outlet in relation to increased stools from IBS, seems resolved. No signs of further bleeding. If has significant bleeding episode would get a stat bleeding scan. Will try to improve her IBS to lessen the  chance of rectal irritation                         GErD: would increase her Pantoprazole to 40mg  po bid for now. Further recommendations to follow.  Electronic Signatures: Stephens November H (NP)  (Signed 10-Sep-15 14:06)  Authored: Brief Consult Note   Last Updated: 10-Sep-15 14:06 by Theodore Demark (NP)

## 2014-05-04 LAB — SURGICAL PATHOLOGY

## 2014-05-24 ENCOUNTER — Observation Stay
Admission: EM | Admit: 2014-05-24 | Discharge: 2014-05-26 | Disposition: A | Discharge status: Home / Self Care | Payer: Commercial Managed Care - HMO | Attending: Internal Medicine | Admitting: Internal Medicine

## 2014-05-24 DIAGNOSIS — R531 Weakness: Principal | ICD-10-CM

## 2014-05-24 DIAGNOSIS — R200 Anesthesia of skin: Secondary | ICD-10-CM | POA: Diagnosis not present

## 2014-05-24 DIAGNOSIS — D649 Anemia, unspecified: Secondary | ICD-10-CM | POA: Diagnosis present

## 2014-05-24 DIAGNOSIS — I10 Essential (primary) hypertension: Secondary | ICD-10-CM | POA: Diagnosis present

## 2014-05-24 DIAGNOSIS — I2510 Atherosclerotic heart disease of native coronary artery without angina pectoris: Secondary | ICD-10-CM | POA: Clinically undetermined

## 2014-05-24 DIAGNOSIS — R748 Abnormal levels of other serum enzymes: Secondary | ICD-10-CM | POA: Insufficient documentation

## 2014-05-24 DIAGNOSIS — R262 Difficulty in walking, not elsewhere classified: Secondary | ICD-10-CM | POA: Insufficient documentation

## 2014-05-24 DIAGNOSIS — J449 Chronic obstructive pulmonary disease, unspecified: Secondary | ICD-10-CM | POA: Diagnosis not present

## 2014-05-24 DIAGNOSIS — R778 Other specified abnormalities of plasma proteins: Secondary | ICD-10-CM | POA: Diagnosis present

## 2014-05-24 DIAGNOSIS — N189 Chronic kidney disease, unspecified: Secondary | ICD-10-CM | POA: Diagnosis present

## 2014-05-24 DIAGNOSIS — Z794 Long term (current) use of insulin: Secondary | ICD-10-CM | POA: Insufficient documentation

## 2014-05-24 DIAGNOSIS — J45909 Unspecified asthma, uncomplicated: Secondary | ICD-10-CM | POA: Diagnosis not present

## 2014-05-24 DIAGNOSIS — I214 Non-ST elevation (NSTEMI) myocardial infarction: Secondary | ICD-10-CM

## 2014-05-24 DIAGNOSIS — I509 Heart failure, unspecified: Secondary | ICD-10-CM | POA: Diagnosis not present

## 2014-05-24 DIAGNOSIS — R9431 Abnormal electrocardiogram [ECG] [EKG]: Secondary | ICD-10-CM | POA: Insufficient documentation

## 2014-05-24 DIAGNOSIS — E782 Mixed hyperlipidemia: Secondary | ICD-10-CM | POA: Diagnosis not present

## 2014-05-24 DIAGNOSIS — E119 Type 2 diabetes mellitus without complications: Secondary | ICD-10-CM

## 2014-05-24 DIAGNOSIS — R600 Localized edema: Secondary | ICD-10-CM | POA: Insufficient documentation

## 2014-05-24 DIAGNOSIS — K219 Gastro-esophageal reflux disease without esophagitis: Secondary | ICD-10-CM | POA: Diagnosis not present

## 2014-05-24 DIAGNOSIS — R0902 Hypoxemia: Secondary | ICD-10-CM | POA: Insufficient documentation

## 2014-05-24 DIAGNOSIS — R110 Nausea: Secondary | ICD-10-CM | POA: Insufficient documentation

## 2014-05-24 DIAGNOSIS — R197 Diarrhea, unspecified: Secondary | ICD-10-CM | POA: Insufficient documentation

## 2014-05-24 DIAGNOSIS — I129 Hypertensive chronic kidney disease with stage 1 through stage 4 chronic kidney disease, or unspecified chronic kidney disease: Secondary | ICD-10-CM | POA: Diagnosis not present

## 2014-05-24 DIAGNOSIS — I4510 Unspecified right bundle-branch block: Secondary | ICD-10-CM | POA: Insufficient documentation

## 2014-05-24 DIAGNOSIS — R7989 Other specified abnormal findings of blood chemistry: Secondary | ICD-10-CM | POA: Diagnosis present

## 2014-05-24 DIAGNOSIS — R609 Edema, unspecified: Secondary | ICD-10-CM | POA: Insufficient documentation

## 2014-05-24 HISTORY — DX: Chronic obstructive pulmonary disease, unspecified: J44.9

## 2014-05-24 HISTORY — DX: Gastro-esophageal reflux disease without esophagitis: K21.9

## 2014-05-24 HISTORY — DX: Unspecified osteoarthritis, unspecified site: M19.90

## 2014-05-24 HISTORY — DX: Anemia, unspecified: D64.9

## 2014-05-24 HISTORY — DX: Type 2 diabetes mellitus without complications: E11.9

## 2014-05-24 HISTORY — DX: Unspecified asthma, uncomplicated: J45.909

## 2014-05-24 HISTORY — DX: Atherosclerotic heart disease of native coronary artery without angina pectoris: I25.10

## 2014-05-24 HISTORY — DX: Chronic kidney disease, unspecified: N18.9

## 2014-05-24 HISTORY — DX: Essential (primary) hypertension: I10

## 2014-05-24 LAB — URINALYSIS COMPLETE WITH MICROSCOPIC (ARMC ONLY)
Bacteria, UA: NONE SEEN
Bilirubin Urine: NEGATIVE
Glucose, UA: NEGATIVE mg/dL
Hgb urine dipstick: NEGATIVE
Ketones, ur: NEGATIVE mg/dL
Leukocytes, UA: NEGATIVE
Nitrite: NEGATIVE
PROTEIN: NEGATIVE mg/dL
Specific Gravity, Urine: 1.01 (ref 1.005–1.030)
pH: 6 (ref 5.0–8.0)

## 2014-05-24 LAB — BASIC METABOLIC PANEL
ANION GAP: 7 (ref 5–15)
BUN: 16 mg/dL (ref 6–20)
CALCIUM: 7.5 mg/dL — AB (ref 8.9–10.3)
CHLORIDE: 105 mmol/L (ref 101–111)
CO2: 27 mmol/L (ref 22–32)
CREATININE: 1.47 mg/dL — AB (ref 0.44–1.00)
GFR calc non Af Amer: 31 mL/min — ABNORMAL LOW (ref >60)
GFR, EST AFRICAN AMERICAN: 36 mL/min — AB (ref >60)
GLUCOSE: 105 mg/dL — AB (ref 65–99)
Potassium: 3.8 mmol/L (ref 3.5–5.1)
Sodium: 139 mmol/L (ref 135–145)

## 2014-05-24 LAB — CBC
HCT: 29.9 % — ABNORMAL LOW (ref 35.0–47.0)
HEMOGLOBIN: 9.5 g/dL — AB (ref 12.0–16.0)
MCH: 29.9 pg (ref 26.0–34.0)
MCHC: 31.7 g/dL — ABNORMAL LOW (ref 32.0–36.0)
MCV: 94.2 fL (ref 80.0–100.0)
Platelets: 307 10*3/uL (ref 150–440)
RBC: 3.18 MIL/uL — AB (ref 3.80–5.20)
RDW: 14.4 % (ref 11.5–14.5)
WBC: 6.8 10*3/uL (ref 3.6–11.0)

## 2014-05-24 LAB — TROPONIN I: Troponin I: 0.04 ng/mL — ABNORMAL HIGH (ref <0.031)

## 2014-05-24 NOTE — ED Notes (Addendum)
Pt c/o generalized weakness for several days; today difficulty standing due numbness in lower extremeties; says any time she stands she has to go to the bathroom to void or have BM; diarrhea daily for a year-is being followed for this; nausea, no vomiting; pt alert and oriented x 3; talking in complete coherant sentences; pt adds she is currently feeling depressed but not having suicidal thoughts

## 2014-05-25 ENCOUNTER — Emergency Department: Payer: Commercial Managed Care - HMO

## 2014-05-25 ENCOUNTER — Observation Stay
Admit: 2014-05-25 | Discharge: 2014-05-25 | Disposition: A | Discharge status: Home / Self Care | Payer: Commercial Managed Care - HMO | Attending: Internal Medicine | Admitting: Internal Medicine

## 2014-05-25 ENCOUNTER — Encounter: Payer: Self-pay | Admitting: Internal Medicine

## 2014-05-25 DIAGNOSIS — R200 Anesthesia of skin: Secondary | ICD-10-CM | POA: Diagnosis not present

## 2014-05-25 DIAGNOSIS — R748 Abnormal levels of other serum enzymes: Secondary | ICD-10-CM | POA: Diagnosis not present

## 2014-05-25 DIAGNOSIS — I2510 Atherosclerotic heart disease of native coronary artery without angina pectoris: Secondary | ICD-10-CM | POA: Clinically undetermined

## 2014-05-25 DIAGNOSIS — R531 Weakness: Secondary | ICD-10-CM

## 2014-05-25 DIAGNOSIS — I10 Essential (primary) hypertension: Secondary | ICD-10-CM | POA: Diagnosis present

## 2014-05-25 DIAGNOSIS — R778 Other specified abnormalities of plasma proteins: Secondary | ICD-10-CM | POA: Diagnosis present

## 2014-05-25 DIAGNOSIS — N189 Chronic kidney disease, unspecified: Secondary | ICD-10-CM | POA: Diagnosis present

## 2014-05-25 DIAGNOSIS — E119 Type 2 diabetes mellitus without complications: Secondary | ICD-10-CM | POA: Diagnosis not present

## 2014-05-25 DIAGNOSIS — I214 Non-ST elevation (NSTEMI) myocardial infarction: Secondary | ICD-10-CM | POA: Diagnosis not present

## 2014-05-25 DIAGNOSIS — I129 Hypertensive chronic kidney disease with stage 1 through stage 4 chronic kidney disease, or unspecified chronic kidney disease: Secondary | ICD-10-CM | POA: Diagnosis not present

## 2014-05-25 DIAGNOSIS — Z794 Long term (current) use of insulin: Secondary | ICD-10-CM | POA: Diagnosis not present

## 2014-05-25 DIAGNOSIS — J449 Chronic obstructive pulmonary disease, unspecified: Secondary | ICD-10-CM | POA: Diagnosis not present

## 2014-05-25 DIAGNOSIS — R7989 Other specified abnormal findings of blood chemistry: Secondary | ICD-10-CM | POA: Diagnosis present

## 2014-05-25 DIAGNOSIS — R609 Edema, unspecified: Secondary | ICD-10-CM | POA: Diagnosis not present

## 2014-05-25 DIAGNOSIS — I509 Heart failure, unspecified: Secondary | ICD-10-CM | POA: Diagnosis not present

## 2014-05-25 DIAGNOSIS — R110 Nausea: Secondary | ICD-10-CM | POA: Diagnosis not present

## 2014-05-25 DIAGNOSIS — D649 Anemia, unspecified: Secondary | ICD-10-CM | POA: Diagnosis present

## 2014-05-25 DIAGNOSIS — R197 Diarrhea, unspecified: Secondary | ICD-10-CM | POA: Diagnosis not present

## 2014-05-25 LAB — BRAIN NATRIURETIC PEPTIDE: B Natriuretic Peptide: 116 pg/mL — ABNORMAL HIGH (ref 0.0–100.0)

## 2014-05-25 LAB — GLUCOSE, CAPILLARY
GLUCOSE-CAPILLARY: 215 mg/dL — AB (ref 65–99)
GLUCOSE-CAPILLARY: 63 mg/dL — AB (ref 65–99)
GLUCOSE-CAPILLARY: 81 mg/dL (ref 65–99)
Glucose-Capillary: 70 mg/dL (ref 65–99)
Glucose-Capillary: 196 mg/dL — ABNORMAL HIGH (ref 65–99)

## 2014-05-25 LAB — TROPONIN I: Troponin I: 0.03 ng/mL (ref <0.031)

## 2014-05-25 MED ORDER — LINAGLIPTIN 5 MG PO TABS
5.0000 mg | ORAL_TABLET | Freq: Every day | ORAL | Status: DC
  Administered 2014-05-25 – 2014-05-26 (×2): 5 mg via ORAL
  Filled 2014-05-25 (×2): qty 1

## 2014-05-25 MED ORDER — FUROSEMIDE 20 MG PO TABS
20.0000 mg | ORAL_TABLET | Freq: Every day | ORAL | Status: DC
  Administered 2014-05-25: 20 mg via ORAL
  Filled 2014-05-25: qty 1

## 2014-05-25 MED ORDER — METOPROLOL SUCCINATE ER 25 MG PO TB24
25.0000 mg | ORAL_TABLET | Freq: Every day | ORAL | Status: DC
  Administered 2014-05-26: 25 mg via ORAL
  Filled 2014-05-25: qty 1

## 2014-05-25 MED ORDER — FERROUS SULFATE 325 (65 FE) MG PO TABS
325.0000 mg | ORAL_TABLET | Freq: Every day | ORAL | Status: DC
  Administered 2014-05-25 – 2014-05-26 (×2): 325 mg via ORAL
  Filled 2014-05-25 (×2): qty 1

## 2014-05-25 MED ORDER — PANTOPRAZOLE SODIUM 40 MG PO TBEC
40.0000 mg | DELAYED_RELEASE_TABLET | Freq: Every day | ORAL | Status: DC
  Administered 2014-05-25 – 2014-05-26 (×2): 40 mg via ORAL
  Filled 2014-05-25 (×2): qty 1

## 2014-05-25 MED ORDER — DIPHENOXYLATE-ATROPINE 2.5-0.025 MG PO TABS: 1.0000 | ORAL_TABLET | Freq: Four times a day (QID) | ORAL | Status: DC | PRN

## 2014-05-25 MED ORDER — MOMETASONE FURO-FORMOTEROL FUM 100-5 MCG/ACT IN AERO
2.0000 | INHALATION_SPRAY | Freq: Two times a day (BID) | RESPIRATORY_TRACT | Status: DC
Start: 2014-05-25 — End: 2014-05-26
  Administered 2014-05-25 – 2014-05-26 (×2): 2 via RESPIRATORY_TRACT
  Filled 2014-05-25: qty 8.8

## 2014-05-25 MED ORDER — INSULIN ASPART 100 UNIT/ML ~~LOC~~ SOLN
0.0000 [IU] | Freq: Three times a day (TID) | SUBCUTANEOUS | Status: DC
  Administered 2014-05-25 – 2014-05-26 (×2): 3 [IU] via SUBCUTANEOUS
  Administered 2014-05-26: 1 [IU] via SUBCUTANEOUS
  Filled 2014-05-25: qty 3
  Filled 2014-05-25: qty 1

## 2014-05-25 MED ORDER — ACETAMINOPHEN 325 MG PO TABS
650.0000 mg | ORAL_TABLET | Freq: Four times a day (QID) | ORAL | Status: DC | PRN
Start: 2014-05-25 — End: 2014-05-26

## 2014-05-25 MED ORDER — HYDRALAZINE HCL 25 MG PO TABS
25.0000 mg | ORAL_TABLET | Freq: Three times a day (TID) | ORAL | Status: DC
  Administered 2014-05-25 – 2014-05-26 (×3): 25 mg via ORAL
  Filled 2014-05-25 (×3): qty 1

## 2014-05-25 MED ORDER — ASPIRIN EC 81 MG PO TBEC
81.0000 mg | DELAYED_RELEASE_TABLET | Freq: Every day | ORAL | Status: DC
  Administered 2014-05-25 – 2014-05-26 (×2): 81 mg via ORAL
  Filled 2014-05-25 (×2): qty 1

## 2014-05-25 MED ORDER — ACETAMINOPHEN 650 MG RE SUPP: 650.0000 mg | Freq: Four times a day (QID) | RECTAL | Status: DC | PRN

## 2014-05-25 MED ORDER — HEPARIN SODIUM (PORCINE) 5000 UNIT/ML IJ SOLN
5000.0000 [IU] | Freq: Three times a day (TID) | INTRAMUSCULAR | Status: DC
  Administered 2014-05-25 – 2014-05-26 (×4): 5000 [IU] via SUBCUTANEOUS
  Filled 2014-05-25 (×4): qty 1

## 2014-05-25 MED ORDER — ASPIRIN 81 MG PO CHEW
CHEWABLE_TABLET | ORAL | Status: AC
  Administered 2014-05-25: 324 mg via ORAL
  Filled 2014-05-25: qty 4

## 2014-05-25 MED ORDER — ASPIRIN 81 MG PO CHEW
324.0000 mg | CHEWABLE_TABLET | Freq: Once | ORAL | Status: AC
  Administered 2014-05-25: 324 mg via ORAL

## 2014-05-25 MED ORDER — ALBUTEROL SULFATE (2.5 MG/3ML) 0.083% IN NEBU: 2.5000 mg | INHALATION_SOLUTION | RESPIRATORY_TRACT | Status: DC | PRN

## 2014-05-25 MED ORDER — PRAVASTATIN SODIUM 20 MG PO TABS
20.0000 mg | ORAL_TABLET | Freq: Every day | ORAL | Status: DC
Start: 2014-05-25 — End: 2014-05-26
  Administered 2014-05-25: 20 mg via ORAL
  Filled 2014-05-25: qty 1

## 2014-05-25 MED ORDER — TRAZODONE HCL 50 MG PO TABS
50.0000 mg | ORAL_TABLET | Freq: Every day | ORAL | Status: DC
  Administered 2014-05-25: 50 mg via ORAL
  Filled 2014-05-25: qty 1

## 2014-05-25 MED ORDER — SODIUM CHLORIDE 0.9 % IV SOLN: 250.0000 mL | INTRAVENOUS | Status: DC | PRN

## 2014-05-25 MED ORDER — SODIUM CHLORIDE 0.9 % IJ SOLN: 3.0000 mL | INTRAMUSCULAR | Status: DC | PRN

## 2014-05-25 MED ORDER — ONDANSETRON HCL 4 MG PO TABS: 4.0000 mg | ORAL_TABLET | Freq: Four times a day (QID) | ORAL | Status: DC | PRN

## 2014-05-25 MED ORDER — ONDANSETRON HCL 4 MG/2ML IJ SOLN: 4.0000 mg | Freq: Four times a day (QID) | INTRAMUSCULAR | Status: DC | PRN

## 2014-05-25 MED ORDER — SODIUM CHLORIDE 0.9 % IJ SOLN
3.0000 mL | Freq: Two times a day (BID) | INTRAMUSCULAR | Status: DC
  Administered 2014-05-25 – 2014-05-26 (×3): 3 mL via INTRAVENOUS

## 2014-05-25 MED ORDER — AMLODIPINE BESYLATE 5 MG PO TABS: 2.5000 mg | ORAL_TABLET | Freq: Every day | ORAL | Status: DC

## 2014-05-25 MED ORDER — FUROSEMIDE 10 MG/ML IJ SOLN
40.0000 mg | Freq: Every day | INTRAMUSCULAR | Status: DC
  Administered 2014-05-25: 40 mg via INTRAVENOUS
  Filled 2014-05-25: qty 4

## 2014-05-25 NOTE — Progress Notes (Signed)
[facility] NAME: Lisa Beard    MR#:  [id number]  DATE OF BIRTH:  21-May-1926  SUBJECTIVE:  Patient presented with generalized weakness and lower extremity edema. Patient has no neurological deficits.  REVIEW OF SYSTEMS:    Review of Systems  Constitutional: Negative for fever and chills.  Respiratory: Negative for cough, sputum production and shortness of breath.   Cardiovascular: Positive for leg swelling. Negative for chest pain, palpitations and orthopnea.  Gastrointestinal: Negative for abdominal pain.  Genitourinary: Negative for urgency.  Neurological: Negative for dizziness, tingling, tremors, sensory change, speech change and focal weakness.    Tolerating Diet: Yes      DRUG ALLERGIES:  No Known Allergies  VITALS:  Blood pressure 173/73, pulse 60, temperature 98.2 F (36.8 C), temperature source Oral, resp. rate 16, height 5\' 2"  (1.575 m), weight 70.353 kg (155 lb 1.6 oz), SpO2 97 %.  PHYSICAL EXAMINATION:   Physical Exam  Constitutional: She is well-developed, well-nourished, and in no distress.  HENT:  Head: Normocephalic and atraumatic.  Eyes: Pupils are equal, round, and reactive to light.  Neck: Normal range of motion. Neck supple. No tracheal deviation present.  Cardiovascular: Normal rate and regular rhythm.   Murmur heard. Pulmonary/Chest: Breath sounds normal. She has no wheezes. She has no rales.  Abdominal: Soft. Bowel sounds are normal. She exhibits no distension. There is no tenderness.  Musculoskeletal: Normal range of motion. She exhibits edema. She exhibits no tenderness.  Neurological: She is alert. No cranial nerve deficit.  Skin: Skin is warm and dry. No rash noted. No erythema.  Psychiatric: Affect normal.      LABORATORY PANEL:   CBC  Recent Labs Lab 05/24/14 2241  WBC 6.8  HGB 9.5*  HCT 29.9*  PLT 307    ------------------------------------------------------------------------------------------------------------------  Chemistries   Recent Labs Lab 05/24/14 2241  NA 139  K 3.8  CL 105  CO2 27  GLUCOSE 105*  BUN 16  CREATININE 1.47*  CALCIUM 7.5*   ------------------------------------------------------------------------------------------------------------------  Cardiac Enzymes  Recent Labs Lab 05/24/14 2241  TROPONINI 0.04*   ------------------------------------------------------------------------------------------------------------------  RADIOLOGY:  Dg Chest Portable 1 View  05/25/2014    IMPRESSION: No acute pulmonary process.   Electronically Signed   By: Jeb Levering M.D.   On: 05/25/2014 02:29     ASSESSMENT AND PLAN:   This is an 81 year old female with past medical history of coronary artery disease and hypertension who presented with generalized weakness and elevated troponin.  #1. Generalized weakness: I suspect this is related to generalized deconditioning and possibly related to her lower extremity edema and elevated blood pressure. This will therapy has been consult that however they are unable to see the patient as she has a slightly elevated troponin. I do not suspect the patient has acute coronary syndrome.   2. Elevated troponin: This is likely not ACS, but we will continue to follow and Cardiology recommendations.  3.Lower extremity edema: I agree with performing echocardiogram to evaluate cardiac function. I have changed her by mouth Lasix to IV Lasix. She is also on Norvasc which I stopped as this can also cause lower extremity edema. I will also order lower extremity Dopplers to evaluate for DVT.   4. Accelerated hypertension: Patient did not receive medications this morning. Her heart rate is low so I have held her metoprolol. I changed her Norvasc to hydralazine. I will continue to monitor blood pressure.   5. Diabetes: Patient will  continue Tradjanta along with sliding scale insulin and ADA diet.  6.Coronary artery disease: Patient will continue on aspirin, statin and metoprolol. Cardiology consultation is pending.     All the records are reviewed and case discussed with  patient's son at bedsidement plans discussed with the patient and she  is in agreement.  CODE STATUS:Full TOTAL TIME TAKING CARE OF THIS PATIENT:  35 minutes.   POSSIBLE D/C IN 1-2 DAYS, DEPENDING ON CLINICAL CONDITION.   Icela Glymph M.D on 05/25/2014 at 12:08 PM  Between 7am to 6pm - Pager - [phone] After 6pm go to [email or web address] - password [email or web address] [facility]  [place] [facility]  Office  [phone]  CC: Primary care physician; [facility], Chrissie Noa, MD

## 2014-05-25 NOTE — Progress Notes (Signed)
PT Cancellation Note  Patient Details Name: Lisa Beard MRN: [id number] DOB: 1927-01-04   Cancelled Treatment:    Reason Eval/Treat Not Completed: Medical issues which prohibited therapy;Patient not medically ready. Pt admitted to floor first thing this morning. Very mildly elevated troponin. Awaiting cardiac enzymes to be cycled. Cardiology consult pending to rule out NSTEMI. Will hold patient until cardiology has ruled out acute MI at which time PT evaluation will be performed.    Phillips Grout, PT   05/25/2014, 9:50 AM

## 2014-05-25 NOTE — Consult Note (Signed)
[facility] Consultation Note  Patient ID: ANGELES Beard, MRN: [id number], DOB/AGE: [birth year] 81 y.o. Admit date: 05/24/2014   Date of Consult: 05/25/2014 Primary Physician: Sofie Hartigan, MD Primary Cardiologist: [facility]  Chief Complaint:  Chief Complaint  Patient presents with  . Nausea  . Numbness  . Diarrhea  . Weakness  . Difficulty Walking   Reason for Consult: elevated troponin with known cardiovascular disease  HPI: 81 y.o. 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 This has been progressive over the next 4-5 days progressive in nature and the patient is unable to do the things she wishes to do. She also has had a new EKG showing normal sinus rhythm with left axis deviation and right bundle branch block which is unchanged from before. At this time she has been using some intravenous Lasix which has helped somewhat from her lower extremity edema and her hypoxia is not an issue at this time. She also has diabetes with reasonable control of her diabetic medications and hemoglobin A1c. Metoprolol has been continued for hypertension control and heart rate control but although this may be causing some bradycardia. There is been no evidence of telemetry changes consistent with bradycardia at this time. He troponin of 0.04 most consistent with demand ischemia rather than acute coronary syndrome  Past Medical History  Diagnosis Date  . Coronary artery disease   . Hypertension   . COPD (chronic obstructive pulmonary disease)   . Asthma   . Diabetes  mellitus without complication   . Chronic kidney disease   . GERD (gastroesophageal reflux disease)   . Arthritis   . Anemia       Surgical History:  Past Surgical History  Procedure Laterality Date  . Eye surgery    . Cholecystectomy    . Abdominal hysterectomy       Home Meds: Prior to Admission medications   Medication Sig Start Date End Date Taking? Authorizing Provider  amLODipine (NORVASC) 2.5 MG tablet Take 2.5 mg by mouth daily.   Yes Historical Provider, MD  diphenoxylate-atropine (LOMOTIL) 2.5-0.025 MG per tablet Take by mouth 4 (four) times daily as needed for diarrhea or loose stools.   Yes Historical Provider, MD  ferrous sulfate 325 (65 FE) MG tablet Take 325 mg by mouth daily with breakfast.   Yes Historical Provider, MD  Fluticasone-Salmeterol (ADVAIR) 250-50 MCG/DOSE AEPB Inhale 1 puff into the lungs 2 (two) times daily.   Yes Historical Provider, MD  furosemide (LASIX) 20 MG tablet Take 20 mg by mouth daily.   Yes Historical Provider, MD  lovastatin (MEVACOR) 20 MG tablet Take 20 mg by mouth at bedtime.   Yes Historical Provider, MD  metoprolol succinate (TOPROL-XL) 25 MG 24 hr tablet Take 25 mg by mouth daily.   Yes Historical Provider, MD  pantoprazole (PROTONIX) 40 MG tablet Take 40 mg by mouth daily.   Yes Historical Provider, MD  sitaGLIPtin (JANUVIA) 50 MG tablet Take 50 mg by mouth daily.   Yes Historical Provider, MD  traZODone (DESYREL) 50 MG tablet Take 50 mg by mouth at bedtime.   Yes Historical Provider, MD  Inpatient Medications:  . aspirin EC  81 mg Oral Daily  . ferrous sulfate  325 mg Oral Q breakfast  . furosemide  40 mg Intravenous Daily  . heparin  5,000 Units Subcutaneous 3 times per day  . hydrALAZINE  25 mg Oral 3 times per day  . insulin aspart  0-9 Units Subcutaneous TID WC  . linagliptin  5 mg Oral Daily  . metoprolol succinate  25 mg Oral Daily  . mometasone-formoterol  2 puff Inhalation BID  . pantoprazole  40 mg Oral Daily  .  pravastatin  20 mg Oral q1800  . sodium chloride  3 mL Intravenous Q12H  . traZODone  50 mg Oral QHS      Allergies: No Known Allergies  History   Social History  . Marital Status: Widowed    Spouse Name: N/A  . Number of Children: N/A  . Years of Education: N/A   Occupational History  . Not on file.   Social History Main Topics  . Smoking status: Never Smoker   . Smokeless tobacco: Not on file  . Alcohol Use: No  . Drug Use: No  . Sexual Activity: Not on file   Other Topics Concern  . Not on file   Social History Narrative  . No narrative on file     Family History  Problem Relation Age of Onset  . CAD Mother   . Cancer Sister   . Hypertension Sister      Review of Systems Positive for weakness fatigue Negative for: General:  chills, fever, night sweats or weight changes.  Cardiovascular: PND orthopnea syncope dizziness  Dermatological skin lesions rashes Respiratory: Cough congestion Urologic: Frequent urination urination at night and hematuria Abdominal: negative for  vomiting, diarrhea, bright red blood per rectum, melena, or hematemesis Neurologic: negative for visual changes, and/or hearing changes  All other systems reviewed and are otherwise negative except as noted above.  Labs:  Recent Labs  05/24/14 2241  TROPONINI 0.04*   Lab Results  Component Value Date   WBC 6.8 05/24/2014   HGB 9.5* 05/24/2014   HCT 29.9* 05/24/2014   MCV 94.2 05/24/2014   PLT 307 05/24/2014    Recent Labs Lab 05/24/14 2241  NA 139  K 3.8  CL 105  CO2 27  BUN 16  CREATININE 1.47*  CALCIUM 7.5*  GLUCOSE 105*   No results found for: CHOL, HDL, LDLCALC, TRIG No results found for: DDIMER  Radiology/Studies:  Dg Chest Portable 1 View  05/25/2014   CLINICAL DATA:  Nausea, diarrhea, weakness. Numbness and difficulty walking.  EXAM: PORTABLE CHEST - 1 VIEW  COMPARISON:  03/25/2014  FINDINGS: Lungs are hyperinflated with probable emphysema. Cardiomediastinal  contours are unchanged, heart at the upper limits of normal in size. Pulmonary vasculature is normal. There is no consolidation, pleural effusion or pneumothorax. Degenerative change of the left shoulder with intra-articular bodies again seen.  IMPRESSION: No acute pulmonary process.   Electronically Signed   By: Jeb Levering M.D.   On: 05/25/2014 02:29    EKG: Normal sinus rhythm with left axis deviation and right bundle-branch block  Weights: Filed Weights   05/24/14 2202 05/25/14 0841  Weight: 161 lb (73.029 kg) 155 lb 1.6 oz (70.353 kg)     Physical Exam: Blood pressure 169/59, pulse 62, temperature 98.1 F (36.7 C), temperature source Oral, resp. rate 16, height 5\' 2"  (1.575 m), weight 155 lb 1.6 oz (70.353 kg), SpO2 98 %. Body mass index is  28.36 kg/(m^2). General: Well developed, well nourished, in no acute distress. Head eyes ears nose throat: Normocephalic, atraumatic, sclera non-icteric, no xanthomas, nares are without discharge. No apparent thyromegaly and/or mass  Lungs: Normal respiratory effort.  no wheezes, no rales, no rhonchi.  Heart: RRR with normal S1 S2. no murmur gallop, no rub, PMI is normal size and placement, carotid upstroke normal without bruit, jugular venous pressure is normal Abdomen: Soft, non-tender, non-distended with normoactive bowel sounds. No hepatomegaly. No rebound/guarding. No obvious abdominal masses. Abdominal aorta is normal size without bruit Extremities: 1-2+ edema. no cyanosis, no clubbing, + ulcers  Peripheral : 2+ bilateral upper extremity pulses, 2+ bilateral femoral pulses, 2+ bilateral dorsal pedal pulse Neuro: Alert and oriented. No facial asymmetry. No focal deficit. Moves all extremities spontaneously. Musculoskeletal: Normal muscle tone without kyphosis Psych:  Responds to questions appropriately with a normal affect.    Assessment: 81 year old female with known coronary artery disease without myocardial infarction and or  significant anginal symptoms with elevated troponin consistent with demand ischemia rather than acute coronary syndrome chronic kidney disease mixed hyperlipidemia and essential hypertension with weakness and fatigue of unknown etiology with possible diastolic dysfunction congestive heart failure  Plan: 1. Continue intravenous Lasix for lower extremity edema and possible diastolic dysfunction congestive heart failure following closely for chronic kidney disease and exacerbation 2. No further intervention of coronary artery disease without evidence of significant anginal symptoms or myocardial infarction 3. Echocardiogram for LV systolic dysfunction and valvular heart disease contributing to above 4. Continue metoprolol for heart rate control and risk reduction cardiovascular event although follow closely telemetry with ambulation for possible bradycardia causing symptoms listed above 5. Mixed hyperlipidemia treatment with statin therapy if able 6. Continue diabetes control with medication management listed above for further risk reduction in cardiovascular event as well as glycohemoglobin A1c below 7  Signed, Corey Skains M.D. [facility] 05/25/2014, 12:54 PM

## 2014-05-25 NOTE — ED Provider Notes (Signed)
[facility] Emergency Department Provider Note  ____________________________________________  Time seen: Approximately 12:34 AM  I have reviewed the triage vital signs and the nursing notes.   HISTORY  Chief Complaint Nausea; Numbness; Diarrhea; Weakness; and Difficulty Walking    HPI Lisa Beard is a 81 y.o. female who presents with constant generalized weakness for a few days, gradual onset, worse since yesterday. She reports her legs feel "heavy and numb" and whenever she tries to stand she has difficulty lifting them because they feel so heavy. She has associated nauseated without vomiting. She has chronic diarrhea. She denies any chest pain or difficulty breathing. No back pain. She has chronic bowel and bladder incontinence. Current severity 8 out of 10. Standing makes her symptoms worse.   No past medical history on file.  There are no active problems to display for this patient.   No past surgical history on file.  No current outpatient prescriptions on file.  Allergies Review of patient's allergies indicates not on file.  No family history on file.  Social History History  Substance Use Topics  . Smoking status: Not on file  . Smokeless tobacco: Not on file  . Alcohol Use: Not on file    Review of Systems Constitutional: No fever/chills Eyes: No visual changes. ENT: No sore throat. Cardiovascular: Denies chest pain. Respiratory: Denies shortness of breath. Gastrointestinal: No abdominal pain.  + nausea, no vomiting.  No diarrhea.  No constipation. Genitourinary: Negative for dysuria. Musculoskeletal: Negative for back pain. Skin: Negative for rash. Neurological: Negative for headaches, focal weakness or numbness.  10-point ROS otherwise negative.  ____________________________________________   PHYSICAL EXAM:  VITAL SIGNS: ED Triage Vitals  Enc Vitals Group     BP 05/24/14 2202 176/74 mmHg     Pulse Rate 05/24/14 2202 76      Resp 05/24/14 2202 18     Temp 05/24/14 2202 98.1 F (36.7 C)     Temp Source 05/24/14 2202 Oral     SpO2 05/24/14 2202 100 %     Weight 05/24/14 2202 161 lb (73.029 kg)     Height 05/24/14 2202 5\' 2"  (1.575 m)     Head Cir --      Peak Flow --      Pain Score 05/24/14 2207 10     Pain Loc --      Pain Edu? --      Excl. in [place]? --     Constitutional: Alert and oriented. Frail-appearing but in no acute distress. Eyes: Conjunctivae are normal. PERRL. EOMI. Head: Atraumatic. Nose: No congestion/rhinnorhea. Mouth/Throat: Mucous membranes are moist.  Oropharynx non-erythematous. Neck: No stridor.  Cardiovascular: Normal rate, regular rhythm. Grossly normal heart sounds.  Good peripheral circulation. Respiratory: Normal respiratory effort.  No retractions. Lungs CTAB. Gastrointestinal: Soft and nontender. No distention. No abdominal bruits. No CVA tenderness. Genitourinary: Deferred Musculoskeletal: 3+ pitting edema bilateral lower extremities Neurologic:  Normal speech and language. No gross focal neurologic deficits are appreciated. Speech is normal. 5 out of 5 strength in bilateral upper and lower extremities in the distal muscle groups, intact sensation to light touch, strong plantar flexion and dorsiflexion of bilateral big toes. Skin:  Skin is warm, dry and intact. No rash noted. Psychiatric: Mood and affect are normal. Speech and behavior are normal.  ____________________________________________   LABS (all labs ordered are listed, but only abnormal results are displayed)  Labs Reviewed  CBC - Abnormal; Notable for the following:    RBC 3.18 (*)  Hemoglobin 9.5 (*)    HCT 29.9 (*)    MCHC 31.7 (*)    All other components within normal limits  BASIC METABOLIC PANEL - Abnormal; Notable for the following:    Glucose, Bld 105 (*)    Creatinine, Ser 1.47 (*)    Calcium 7.5 (*)    GFR calc non Af Amer 31 (*)    GFR calc Af Amer 36 (*)    All other components within  normal limits  TROPONIN I - Abnormal; Notable for the following:    Troponin I 0.04 (*)    All other components within normal limits  URINALYSIS COMPLETEWITH MICROSCOPIC ([facility])  - Abnormal; Notable for the following:    Color, Urine STRAW (*)    APPearance CLEAR (*)    Squamous Epithelial / LPF 0-5 (*)    All other components within normal limits  BRAIN NATRIURETIC PEPTIDE - Abnormal; Notable for the following:    B Natriuretic Peptide 116.0 (*)    All other components within normal limits   ____________________________________________  EKG  ED ECG REPORT   Date: 05/25/2014  EKG Time: 22:23  Rate: 67  Rhythm: normal sinus rhythm  Axis: left  Intervals:right bundle branch block  ST&T Change: T wave inversions in leads 3, V3, Q waves in lead 2, lead 3, aVF, V1, V2, V3  ____________________________________________  RADIOLOGY CXR: IMPRESSION: No acute pulmonary process.  ____________________________________________   PROCEDURES  Procedure(s) performed: None  Critical Care performed: Yes, see critical care note(s). Total critical care time spent 40 minutes.  ____________________________________________   INITIAL IMPRESSION / ASSESSMENT AND PLAN / ED COURSE  Pertinent labs & imaging results that were available during my care of the patient were reviewed by me and considered in my medical decision making (see chart for details).  Lisa Beard is a 81 y.o. female resents with constant generalized weakness for a few days, gradual onset, worse since yesterday. On exam, she appears frail but is not in any acute distress. Labs show troponin elevation concerning for NSTEMI, BNP also mildly elevated and given her bilateral lower extremity edema suspect acute onset mild heart failure in the setting of MI. Aspirin ordered. She has intact neuro exam in the lower extremities and I doubt cauda equina - I suspect she is having difficulty lifting her legs because they are so  swollen. Discussed with hospitalist for admission. ____________________________________________   FINAL CLINICAL IMPRESSION(S) / ED DIAGNOSES  Final diagnoses:  NSTEMI, initial episode of care  Bilateral edema of lower extremity  Acute congestive heart failure, unspecified congestive heart failure type      Joanne Gavel, MD 05/25/14 [phone]

## 2014-05-25 NOTE — ED Notes (Signed)
Helped pt. Walk to bathroom in room.

## 2014-05-25 NOTE — Progress Notes (Signed)
Pt alert and oriented x4, no complaints of pain or discomfort.  Bed in low position, call bell within reach.  Bed alarms on, bed in low position and functioning.  Assessment done and charted.  Will continue to monitor and do hourly rounding throughout the shift. Pt is a 81 year old female admitted with Dx of generalized weakness and difficulty walking.  No verbal complaints of pain or discomfort.  Complaining of having diarrhea.  Will send a stool to the lab to check for C-Diff.  Pt lives at home with two sons.  Plans for discharge is to return home

## 2014-05-25 NOTE — H&P (Signed)
[facility] NAME: Lisa Beard    MR#:  [id number]  DATE OF BIRTH:  Jan 06, 1927  DATE OF ADMISSION:  05/24/2014  PRIMARY CARE PHYSICIAN: Sofie Hartigan, MD   REQUESTING/REFERRING PHYSICIAN: Gwynneth Aliment  CHIEF COMPLAINT:   Chief Complaint  Patient presents with  . Nausea  . Numbness  . Diarrhea  . Weakness  . Difficulty Walking    HISTORY OF PRESENT ILLNESS:  Lisa Beard  is a 81 y.o. female with below mentioned past medical history presents to the emergency room with the complaints of ongoing generalized weakness for the past few days, increasing leg swelling causing her difficulty in walking for the past few days. Denies any fever, cough, shortness of breath, chest pain. Does have chronic diarrhea, mild nausea present, no vomiting or abdominal pain. Denies any dysuria. In the emergency room patient was evaluated by the ED physician and found to be with stable vital signs and workup revealed mildly elevated troponin of 0.04. EKG normal sinus rhythm with ventricular rate of 67 bpm, T-wave inversions in lead 3 and V3. Rest of lab work stable CKD and anemia. Chest x-ray negative for any acute pathology.  PAST MEDICAL HISTORY:   Past Medical History  Diagnosis Date  . Coronary artery disease   . Hypertension   . COPD (chronic obstructive pulmonary disease)   . Asthma   . Diabetes mellitus without complication   . Chronic kidney disease   . GERD (gastroesophageal reflux disease)   . Arthritis   . Anemia     PAST SURGICAL HISTORY:   Past Surgical History  Procedure Laterality Date  . Eye surgery    . Cholecystectomy    . Abdominal hysterectomy      SOCIAL HISTORY:   History  Substance Use Topics  . Smoking status: Never Smoker   . Smokeless tobacco: Not on file  . Alcohol Use: No    FAMILY HISTORY:   Family History  Problem Relation Age of Onset  . CAD Mother   . Cancer Sister   . Hypertension  Sister     DRUG ALLERGIES:  Not on File  REVIEW OF SYSTEMS:   Review of Systems  Constitutional: Positive for malaise/fatigue. Negative for fever and chills.  HENT: Negative for ear pain, hearing loss, nosebleeds, sore throat and tinnitus.   Eyes: Negative for blurred vision, double vision, pain, discharge and redness.  Respiratory: Negative for cough, hemoptysis, sputum production, shortness of breath and wheezing.   Cardiovascular: Positive for leg swelling. Negative for chest pain, palpitations and orthopnea.  Gastrointestinal: Positive for nausea and diarrhea. Negative for vomiting, abdominal pain, constipation, blood in stool and melena.  Genitourinary: Negative for dysuria, urgency, frequency and hematuria.  Musculoskeletal: Negative for back pain, joint pain and neck pain.  Skin: Negative for itching and rash.  Neurological: Negative for dizziness, tingling, sensory change, focal weakness and seizures.  Endo/Heme/Allergies: Does not bruise/bleed easily.  Psychiatric/Behavioral: Negative for depression. The patient is not nervous/anxious.     MEDICATIONS AT HOME:   Prior to Admission medications   Medication Sig Start Date End Date Taking? Authorizing Provider  amLODipine (NORVASC) 2.5 MG tablet Take 2.5 mg by mouth daily.   Yes Historical Provider, MD  diphenoxylate-atropine (LOMOTIL) 2.5-0.025 MG per tablet Take by mouth 4 (four) times daily as needed for diarrhea or loose stools.   Yes Historical Provider, MD  ferrous sulfate 325 (65 FE) MG tablet Take 325 mg by  mouth daily with breakfast.   Yes Historical Provider, MD  Fluticasone-Salmeterol (ADVAIR) 250-50 MCG/DOSE AEPB Inhale 1 puff into the lungs 2 (two) times daily.   Yes Historical Provider, MD  furosemide (LASIX) 20 MG tablet Take 20 mg by mouth daily.   Yes Historical Provider, MD  lovastatin (MEVACOR) 20 MG tablet Take 20 mg by mouth at bedtime.   Yes Historical Provider, MD  metoprolol succinate (TOPROL-XL) 25 MG  24 hr tablet Take 25 mg by mouth daily.   Yes Historical Provider, MD  pantoprazole (PROTONIX) 40 MG tablet Take 40 mg by mouth daily.   Yes Historical Provider, MD  sitaGLIPtin (JANUVIA) 50 MG tablet Take 50 mg by mouth daily.   Yes Historical Provider, MD  traZODone (DESYREL) 50 MG tablet Take 50 mg by mouth at bedtime.   Yes Historical Provider, MD      VITAL SIGNS:  Blood pressure 170/74, pulse 72, temperature 98.1 F (36.7 C), temperature source Oral, resp. rate 19, height 5\' 2"  (1.575 m), weight 73.029 kg (161 lb), SpO2 97 %.  PHYSICAL EXAMINATION:  Physical Exam  Constitutional: She is oriented to person, place, and time. She appears well-developed and well-nourished. No distress.  HENT:  Head: Normocephalic and atraumatic.  Right Ear: External ear normal.  Left Ear: External ear normal.  Nose: Nose normal.  Mouth/Throat: Oropharynx is clear and moist. No oropharyngeal exudate.  Eyes: EOM are normal. Pupils are equal, round, and reactive to light. No scleral icterus.  Neck: Normal range of motion. Neck supple. No JVD present. No thyromegaly present.  Cardiovascular: Normal rate, regular rhythm, normal heart sounds and intact distal pulses.  Exam reveals no friction rub.   No murmur heard. Respiratory: Effort normal and breath sounds normal. No respiratory distress. She has no wheezes. She has no rales. She exhibits no tenderness.  GI: Soft. Bowel sounds are normal. She exhibits no distension and no mass. There is no tenderness. There is no rebound and no guarding.  Musculoskeletal: Normal range of motion. She exhibits edema.  Lymphadenopathy:    She has no cervical adenopathy.  Neurological: She is alert and oriented to person, place, and time. She has normal reflexes. She displays normal reflexes. No cranial nerve deficit. She exhibits normal muscle tone.  Skin: Skin is warm. No rash noted. No erythema.  Psychiatric: She has a normal mood and affect. Her behavior is normal.  Thought content normal.   LABORATORY PANEL:   CBC  Recent Labs Lab 05/24/14 2241  WBC 6.8  HGB 9.5*  HCT 29.9*  PLT 307   ------------------------------------------------------------------------------------------------------------------  Chemistries   Recent Labs Lab 05/24/14 2241  NA 139  K 3.8  CL 105  CO2 27  GLUCOSE 105*  BUN 16  CREATININE 1.47*  CALCIUM 7.5*   ------------------------------------------------------------------------------------------------------------------  Cardiac Enzymes  Recent Labs Lab 05/24/14 2241  TROPONINI 0.04*   ------------------------------------------------------------------------------------------------------------------  RADIOLOGY:  Dg Chest Portable 1 View  05/25/2014   CLINICAL DATA:  Nausea, diarrhea, weakness. Numbness and difficulty walking.  EXAM: PORTABLE CHEST - 1 VIEW  COMPARISON:  03/25/2014  FINDINGS: Lungs are hyperinflated with probable emphysema. Cardiomediastinal contours are unchanged, heart at the upper limits of normal in size. Pulmonary vasculature is normal. There is no consolidation, pleural effusion or pneumothorax. Degenerative change of the left shoulder with intra-articular bodies again seen.  IMPRESSION: No acute pulmonary process.   Electronically Signed   By: Jeb Levering M.D.   On: 05/25/2014 02:29    EKG:   Orders  placed or performed during the hospital encounter of 05/24/14  . ED EKG normal sinus rhythm with ventricular rate of 67 bpm. L AD. Incomplete RBBB. T-wave inversions in lead 3 and V3   . ED EKG    IMPRESSION AND PLAN:  1. Generalized weakness. Etiology not known, likely deconditioning. Monitor, PT consultation. 2. Elevated troponin, history of CAD. Patient with no chest pain. Demand ischemia versus NSTEMI. Plan: Admit to telemetry, aspirin, beta blocker, cycle cardiac enzymes. Order echocardiogram and cardiac consultation for further workup. 3. Coronary artery disease. 4.  Hypertension, stable. Continue home medications 5. Chronic kidney disease, creatinine stable at baseline of 1.47 6 chronic anemia, H&H stable at baseline of 9.5/29.9. 7. Diabetes mellitus type 2 stable on home medications, continue same plus SSI. 8. COPD/asthma, stable on home medications. Continue same. 9. Hyperlipidemia stable on statin continue same. 10. Chronic diarrhea, status post GI workup. Patient on Lomotil continue same    All the records are reviewed and case discussed with ED provider. Management plans discussed with the patient, family and they are in agreement.  CODE STATUS: Full code  TOTAL TIME TAKING CARE OF THIS PATIENT:50 minutes.    Juluis Mire M.D on 05/25/2014 at 3:50 AM  Between 7am to 6pm - Pager - [phone]  After 6pm go to [email or web address] - password [email or web address] [facility]  [facility]  Office  [phone]  CC: Primary care physician; [facility], Chrissie Noa, MD

## 2014-05-25 NOTE — Evaluation (Signed)
Physical Therapy Evaluation Patient Details Name: Lisa Beard MRN: [id number] DOB: 1926-08-14 Today's Date: 05/25/2014   History of Present Illness  Lisa Beard is a 81 y.o. female presented to the emergency room with the complaints of ongoing generalized weakness for the past few days, increasing leg swelling causing her difficulty in walking for the past few days. Denied any fever, cough, shortness of breath, chest pain. Does have chronic diarrhea, mild nausea present, no vomiting or abdominal pain. Denied any dysuria. In the emergency room patient was evaluated by the ED physician and found to be with stable vital signs and workup revealed mildly elevated troponin of 0.04. EKG normal sinus rhythm with ventricular rate of 67 bpm, T-wave inversions in lead 3 and V3. Rest of lab work stable CKD and anemia. Chest x-ray negative for any acute pathology. Hospitalist admitted patient and consulted cardiology who ruled out acute cardiac event. MD cleared pt for participation with physical therapy. Daughter is present with patient upon arrival and both parties deny any falls in the last 12 months.   Clinical Impression  Pt demonstrates reasonable balance and safety during session but does have some stumble when attempting head turns with gait. Encouraged use of rollator at discharge. Per daughter mobility is close to baseline if not better due to decreased LE swelling since admission as pt has been diuresed. Generally pt is fairly sedentary at home. Recommend HH PT but pt and daughter are unsure if they would like to pursue PT at discharge.      Follow Up Recommendations Home health PT    Equipment Recommendations  None recommended by PT (Pt encouraged to use rollator upon discharge)    Recommendations for Other Services       Precautions / Restrictions Precautions Precautions: None Restrictions Weight Bearing Restrictions: No      Mobility  Bed Mobility Overal bed mobility: Modified  Independent             General bed mobility comments: Use of bed rail  Transfers Overall transfer level: Modified independent (Needs UE support during transfer. Good hand placement) Equipment used: Straight cane             General transfer comment: Pt with good stability and weight transfer  Ambulation/Gait Ambulation/Gait assistance: Min guard Ambulation Distance (Feet): 40 Feet Assistive device: Straight cane Gait Pattern/deviations: Decreased step length - right;Decreased step length - left Gait velocity: Decreased Gait velocity interpretation: <1.8 ft/sec, indicative of risk for recurrent falls General Gait Details: Decreased speed and step length. Pt with LOB during horizontal head turns with gait requiring therapist correction to prevent fall. Pt reports nausea during ambulation limiting participation  Stairs            Wheelchair Mobility    Modified Rankin (Stroke Patients Only)       Balance Overall balance assessment: Needs assistance Sitting-balance support: No upper extremity supported Sitting balance-Leahy Scale: Good     Standing balance support: No upper extremity supported Standing balance-Leahy Scale: Fair   Single Leg Stance - Right Leg: 1 Single Leg Stance - Left Leg: 1     Rhomberg - Eyes Opened: 30 Rhomberg - Eyes Closed: 5                 Pertinent Vitals/Pain Pain Assessment: 0-10 Pain Score: 8  Pain Location: Stomach (chronic prior to admission with meals) (RN present during patient pain complaints) Pain Intervention(s): Limited activity within patient's tolerance (Pt reports nausea which limits activity)  Home Living Family/patient expects to be discharged to:: Private residence Living Arrangements: Children (Lives with son. Has 24/7 assist at home) Available Help at Discharge: Family Type of Home: Mobile home Home Access: Stairs to enter Entrance Stairs-Rails: Can reach both Entrance Stairs-Number of Steps:  4 Home Layout: One level Home Equipment: [place] - 4 wheels;Cane - single point;Shower seat;Tub bench (no grab bars) Additional Comments: Sleeps in recliner    Prior Function Level of Independence: Independent         Comments: Independent with ADLs. Assist with IADLs     Hand Dominance   Dominant Hand: Right    Extremity/Trunk Assessment   Upper Extremity Assessment: Generalized weakness (Grossly 4-/5 througout UE)           Lower Extremity Assessment: Generalized weakness (3+/5 hip flexion, otherwise 4- to 4/5 throughout)         Communication   Communication: No difficulties  Cognition Arousal/Alertness: Awake/alert Behavior During Therapy: WFL for tasks assessed/performed Overall Cognitive Status: Within Functional Limits for tasks assessed                      General Comments      Exercises        Assessment/Plan    PT Assessment Patient needs continued PT services  PT Diagnosis Difficulty walking;Generalized weakness   PT Problem List Decreased strength;Decreased activity tolerance;Decreased balance;Decreased mobility;Decreased safety awareness  PT Treatment Interventions Gait training;Stair training;Therapeutic activities;Therapeutic exercise;Balance training;Neuromuscular re-education;Patient/family education;DME instruction   PT Goals (Current goals can be found in the Care Plan section) Acute Rehab PT Goals Patient Stated Goal: "I want to go back home" PT Goal Formulation: With patient/family Time For Goal Achievement: 06/08/14 Potential to Achieve Goals: Fair    Frequency Min 2X/week   Barriers to discharge        Co-evaluation               End of Session Equipment Utilized During Treatment: Gait belt Activity Tolerance: Patient limited by fatigue (limited by nausea) Patient left: in bed;with call bell/phone within reach;with bed alarm set Nurse Communication: Other (comment) (Stool in hat for collection)          Time: 1470-9295 PT Time Calculation (min) (ACUTE ONLY): 25 min   Charges:   PT Evaluation $Initial PT Evaluation Tier I: 1 Procedure     PT G CodesPhillips Grout, PT  Rainelle Sulewski 05/25/2014, 2:55 PM

## 2014-05-25 NOTE — Care Management (Addendum)
Order present for CM assessment for discharge planning.  Admitted for sx concerning for cva.     Has health insurance.  No issues accessing medical care, obtaining medications, maintaining housing, utilities and food. Daughter Lezlie Octave is present .  Patient appears to be oriented  but is a little hard of hearing.  Resides at home with her son.  Ambulates with the aid of a walker and patient says "I give my own bath." Testing for cva is in progress.  Patient is followed by Dr Alinda Deem at [facility] in [place]

## 2014-05-25 NOTE — Progress Notes (Signed)
ASSESSMENT: done,  Redness found on buttock stage one.  No breakage of skin noted.  Pt states she does not feel well. Will continue to monitor.  No known allergies per pt.  Pt is very week but is able to ambulate to the bathroom with assistance.

## 2014-05-26 ENCOUNTER — Observation Stay: Payer: Commercial Managed Care - HMO

## 2014-05-26 DIAGNOSIS — R7989 Other specified abnormal findings of blood chemistry: Secondary | ICD-10-CM | POA: Diagnosis not present

## 2014-05-26 DIAGNOSIS — I10 Essential (primary) hypertension: Secondary | ICD-10-CM | POA: Diagnosis not present

## 2014-05-26 DIAGNOSIS — I129 Hypertensive chronic kidney disease with stage 1 through stage 4 chronic kidney disease, or unspecified chronic kidney disease: Secondary | ICD-10-CM | POA: Diagnosis not present

## 2014-05-26 DIAGNOSIS — Z794 Long term (current) use of insulin: Secondary | ICD-10-CM | POA: Diagnosis not present

## 2014-05-26 DIAGNOSIS — R600 Localized edema: Secondary | ICD-10-CM | POA: Diagnosis not present

## 2014-05-26 DIAGNOSIS — I509 Heart failure, unspecified: Secondary | ICD-10-CM | POA: Diagnosis not present

## 2014-05-26 DIAGNOSIS — I2510 Atherosclerotic heart disease of native coronary artery without angina pectoris: Secondary | ICD-10-CM | POA: Diagnosis not present

## 2014-05-26 DIAGNOSIS — R609 Edema, unspecified: Secondary | ICD-10-CM | POA: Diagnosis not present

## 2014-05-26 DIAGNOSIS — R748 Abnormal levels of other serum enzymes: Secondary | ICD-10-CM | POA: Diagnosis not present

## 2014-05-26 DIAGNOSIS — J449 Chronic obstructive pulmonary disease, unspecified: Secondary | ICD-10-CM | POA: Diagnosis not present

## 2014-05-26 DIAGNOSIS — E119 Type 2 diabetes mellitus without complications: Secondary | ICD-10-CM | POA: Diagnosis not present

## 2014-05-26 DIAGNOSIS — R531 Weakness: Secondary | ICD-10-CM | POA: Diagnosis not present

## 2014-05-26 LAB — BASIC METABOLIC PANEL
Anion gap: 6 (ref 5–15)
BUN: 17 mg/dL (ref 6–20)
CALCIUM: 7.2 mg/dL — AB (ref 8.9–10.3)
CO2: 29 mmol/L (ref 22–32)
CREATININE: 1.58 mg/dL — AB (ref 0.44–1.00)
Chloride: 104 mmol/L (ref 101–111)
GFR calc Af Amer: 33 mL/min — ABNORMAL LOW (ref >60)
GFR, EST NON AFRICAN AMERICAN: 29 mL/min — AB (ref >60)
Glucose, Bld: 97 mg/dL (ref 65–99)
Potassium: 3.2 mmol/L — ABNORMAL LOW (ref 3.5–5.1)
Sodium: 139 mmol/L (ref 135–145)

## 2014-05-26 LAB — GLUCOSE, CAPILLARY
GLUCOSE-CAPILLARY: 140 mg/dL — AB (ref 65–99)
Glucose-Capillary: 244 mg/dL — ABNORMAL HIGH (ref 65–99)

## 2014-05-26 LAB — TSH: TSH: 3.191 u[IU]/mL (ref 0.350–4.500)

## 2014-05-26 MED ORDER — FUROSEMIDE 40 MG PO TABS
40.0000 mg | ORAL_TABLET | Freq: Every day | ORAL | Status: DC
  Administered 2014-05-26: 40 mg via ORAL
  Filled 2014-05-26: qty 1

## 2014-05-26 MED ORDER — HYDRALAZINE HCL 25 MG PO TABS
25.0000 mg | ORAL_TABLET | Freq: Once | ORAL | Status: AC
  Administered 2014-05-26: 25 mg via ORAL
  Filled 2014-05-26: qty 1

## 2014-05-26 MED ORDER — POTASSIUM CHLORIDE 20 MEQ/15ML (10%) PO SOLN
40.0000 meq | Freq: Once | ORAL | Status: AC
  Administered 2014-05-26: 40 meq via ORAL
  Filled 2014-05-26: qty 30

## 2014-05-26 MED ORDER — HYDRALAZINE HCL 50 MG PO TABS: 50.0000 mg | ORAL_TABLET | Freq: Three times a day (TID) | ORAL | Status: DC

## 2014-05-26 NOTE — Progress Notes (Signed)
[facility] Cardiology [facility] Encounter Note  Patient: Lisa Beard / Admit Date: 05/24/2014 / Date of Encounter: 05/26/2014, 12:23 PM   Subjective: Patient still somewhat weak and fatigued  Review of Systems: Positive for: Weakness and fatigue Negative for: Vision change, hearing change, syncope, dizziness, nausea, vomiting,diarrhea, bloody stool, stomach pain, cough, congestion, diaphoresis, urinary frequency, urinary pain,skin lesions, skin rashes Others previously listed  Objective: Telemetry: Normal sinus rhythm Physical Exam: Blood pressure 131/71, pulse 81, temperature 98.5 F (36.9 C), temperature source Oral, resp. rate 16, height 5\' 2"  (1.575 m), weight 148 lb 12.8 oz (67.495 kg), SpO2 98 %. Body mass index is 27.21 kg/(m^2). General: Well developed, well nourished, in no acute distress. Head: Normocephalic, atraumatic, sclera non-icteric, no xanthomas, nares are without discharge. Neck: No apparent masses Lungs: Normal respirations with + wheezes, no rhonchi, no rales , + crackles   Heart: Regular rate and rhythm, normal S1 S2, no murmur, no rub, no gallop, PMI is normal size and placement, carotid upstroke normal without bruit, jugular venous pressure normal Abdomen: Soft,  tender, non-distended with normoactive bowel sounds. No hepatosplenomegaly. Abdominal aorta is normal size without bruit Extremities: No edema, no clubbing, no cyanosis, no ulcers,  Peripheral: 2+ radial, 2+ femoral, !+ dorsal pedal pulses Neuro: Alert and oriented. Moves all extremities spontaneously. Psych:  Responds to questions appropriately with a normal affect.   Intake/Output Summary (Last 24 hours) at 05/26/14 1223 Last data filed at 05/26/14 1114  Gross per 24 hour  Intake    840 ml  Output   1316 ml  Net   -476 ml    Inpatient Medications:  . aspirin EC  81 mg Oral Daily  . ferrous sulfate  325 mg Oral Q breakfast  . furosemide  40 mg Oral Daily  . heparin  5,000 Units  Subcutaneous 3 times per day  . hydrALAZINE  50 mg Oral 3 times per day  . insulin aspart  0-9 Units Subcutaneous TID WC  . linagliptin  5 mg Oral Daily  . metoprolol succinate  25 mg Oral Daily  . mometasone-formoterol  2 puff Inhalation BID  . pantoprazole  40 mg Oral Daily  . pravastatin  20 mg Oral q1800  . sodium chloride  3 mL Intravenous Q12H  . traZODone  50 mg Oral QHS   Infusions:    Labs:  Recent Labs  05/24/14 2241 05/26/14 0346  NA 139 139  K 3.8 3.2*  CL 105 104  CO2 27 29  GLUCOSE 105* 97  BUN 16 17  CREATININE 1.47* 1.58*  CALCIUM 7.5* 7.2*   No results for input(s): AST, ALT, ALKPHOS, BILITOT, PROT, ALBUMIN in the last 72 hours.  Recent Labs  05/24/14 2241  WBC 6.8  HGB 9.5*  HCT 29.9*  MCV 94.2  PLT 307    Recent Labs  05/24/14 2241 05/25/14 1438 05/25/14 2046  TROPONINI 0.04* 0.03 <0.03   Invalid input(s): POCBNP No results for input(s): HGBA1C in the last 72 hours.   Weights: Filed Weights   05/24/14 2202 05/25/14 0841 05/26/14 0504  Weight: 161 lb (73.029 kg) 155 lb 1.6 oz (70.353 kg) 148 lb 12.8 oz (67.495 kg)     Radiology/Studies:  Dg Chest Portable 1 View  05/25/2014   CLINICAL DATA:  Nausea, diarrhea, weakness. Numbness and difficulty walking.  EXAM: PORTABLE CHEST - 1 VIEW  COMPARISON:  03/25/2014  FINDINGS: Lungs are hyperinflated with probable emphysema. Cardiomediastinal contours are unchanged, heart at the upper limits of  normal in size. Pulmonary vasculature is normal. There is no consolidation, pleural effusion or pneumothorax. Degenerative change of the left shoulder with intra-articular bodies again seen.  IMPRESSION: No acute pulmonary process.   Electronically Signed   By: Jeb Levering M.D.   On: 05/25/2014 02:29     Assessment and Recommendation  81 y.o. female with known coronary artery disease with elevated troponin most consistent with demand ischemia rather than acute coronary syndrome multifactorial in  nature as well as chronic kidney disease stage III mixed hyperlipidemia and stable but abnormal EKG with normal sinus rhythm with left axis deviation and right bundle branch block and malignant hypertension slowly improving 1. Continue current medical regimen for hypertension control including hydralazine metoprolol 2. Continue antiplatelet medication management for further risk reduction cardiovascular event 3. Furosemide for lower extremity edema and pulmonary edema with closely watching for worsening chronic kidney disease 4. Echocardiogram for LV systolic dysfunction valvular heart disease contributing to above 5. Further diagnostic testing and treatment options after ambulation and following 4 improvements of symptoms   Signed, Serafina Royals M.D. FACC

## 2014-05-26 NOTE — Progress Notes (Signed)
Discharging today with home health services. Spoke with daughter, she is agreeable. No agency preference. TC to Stoney Bang with [facility] with home health nursing and PT referral.

## 2014-05-26 NOTE — Progress Notes (Signed)
Physical Therapy Treatment Patient Details Name: Lisa Beard MRN: [id number] DOB: 06/17/1926 Today's Date: 05/26/2014    History of Present Illness Lisa Beard is a 81 y.o. female presented to the emergency room with the complaints of ongoing generalized weakness for the past few days, increasing leg swelling causing her difficulty in walking for the past few days. Denied any fever, cough, shortness of breath, chest pain. Does have chronic diarrhea, mild nausea present, no vomiting or abdominal pain. Denied any dysuria. In the emergency room patient was evaluated by the ED physician and found to be with stable vital signs and workup revealed mildly elevated troponin of 0.04. EKG normal sinus rhythm with ventricular rate of 67 bpm, T-wave inversions in lead 3 and V3. Rest of lab work stable CKD and anemia. Chest x-ray negative for any acute pathology. Hospitalist admitted patient and consulted cardiology who ruled out acute cardiac event. MD cleared pt for participation with physical therapy. Daughter is present with patient upon arrival and both parties deny any falls in the last 12 months.     PT Comments    Pt progressing well towards all goals. Educated pt/family on appropriate device for ambulation depending on surface and place. Suggested cane for home use and rollator, which pt currently has for outdoor use and away from home (ie. Store, unfamiliar places). Pt and family understand. Pt's family notes pt is walking pretty much at her baseline, but notes she does like to get out in the yard quite a bit. Pt would benefit from home PT to improve balance for tolerating outdoor activities.   Follow Up Recommendations  Home health PT     Equipment Recommendations  None recommended by PT    Recommendations for Other Services       Precautions / Restrictions Precautions Precautions: None Restrictions Weight Bearing Restrictions: No    Mobility  Bed Mobility Overal bed mobility: Modified  Independent             General bed mobility comments: Use of bed rail  Transfers Overall transfer level: Modified independent Equipment used: Straight cane             General transfer comment:  (Good safety)  Ambulation/Gait Ambulation/Gait assistance: Min guard Ambulation Distance (Feet): 160 Feet (2nd walk 40 ft, negotiated 5 steps and 90ft post steps) Assistive device: Straight cane Gait Pattern/deviations: Step-through pattern;Drifts right/left Gait velocity: Decreased Gait velocity interpretation: <1.8 ft/sec, indicative of risk for recurrent falls General Gait Details:  (mildly decreased speed; a few self corrected minor LOB )   Stairs Stairs: Yes Stairs assistance: Min guard Stair Management: One rail Right;Step to pattern;With cane Number of Stairs: 5    Wheelchair Mobility    Modified Rankin (Stroke Patients Only)       Balance           Standing balance support: Single extremity supported;During functional activity Standing balance-Leahy Scale: Good                      Cognition Arousal/Alertness: Awake/alert Behavior During Therapy: WFL for tasks assessed/performed Overall Cognitive Status: Within Functional Limits for tasks assessed                      Exercises      General Comments        Pertinent Vitals/Pain Pain Location: Stomach pain Pain Intervention(s): Monitored during session (Notes some nausea)    Home Living  Prior Function            PT Goals (current goals can now be found in the care plan section) Progress towards PT goals: Progressing toward goals    Frequency  Min 2X/week    PT Plan Current plan remains appropriate    Co-evaluation             End of Session Equipment Utilized During Treatment: Gait belt Activity Tolerance: Patient tolerated treatment well (Notes feeling fine post session) Patient left: in bed;with call bell/phone within  reach;with bed alarm set;with family/visitor present     Time: 2409-7353 PT Time Calculation (min) (ACUTE ONLY): 21 min  Charges:  $Gait Training: 8-22 mins                    G Codes:      Charlaine Dalton 05/26/2014, 11:04 AM

## 2014-05-26 NOTE — Progress Notes (Signed)
Discharge instructions along with home medication list and follow up gone over with patient and family, daughter and patient verbalized that they understood instructions. No rx given to patient. Iv and telemetry removed. Patient to be discharged home with home health. On room air, no distress noted.  [place]

## 2014-05-26 NOTE — Discharge Summary (Signed)
[facility] [place] NAME: Lisa Beard    MR#:  [id number]  DATE OF BIRTH:  01/12/1926  DATE OF ADMISSION:  05/24/2014 ADMITTING PHYSICIAN: Juluis Mire, MD  DATE OF DISCHARGE: 05/26/2014  PRIMARY CARE PHYSICIAN: FELDPAUSCH, Chrissie Noa, MD    ADMISSION DIAGNOSIS:  NSTEMI, initial episode of care [I21.4] Bilateral edema of lower extremity [R60.0] Acute congestive heart failure, unspecified congestive heart failure type [I50.9]  DISCHARGE DIAGNOSIS:  Principal Problem:   General weakness Active Problems:   Elevated troponin   CAD (coronary artery disease)   HTN (hypertension)   DM (diabetes mellitus)   COPD (chronic obstructive pulmonary disease)   CKD (chronic kidney disease)   Chronic anemia   SECONDARY DIAGNOSIS:   Past Medical History  Diagnosis Date  . Coronary artery disease   . Hypertension   . COPD (chronic obstructive pulmonary disease)   . Asthma   . Diabetes mellitus without complication   . Chronic kidney disease   . GERD (gastroesophageal reflux disease)   . Arthritis   . Anemia     HOSPITAL COURSE:  This is a 81 year old female with past medical history significant for coronary artery disease and hypertension who presented with generalized weakness and elevated troponin. For further details please refer to the H&P.  1. Generalized weakness: This is suspected to be secondary to deconditioning and her lower extremity edema. Patient's x-ray doing quite well. Patient was seen and evaluated by physical therapy who recommended home with home health care.  2. Elevated troponin: Patient's troponin was very mildly elevated. This is not indication of acute coronary syndrome. Cardiology was consulted in regards to her elevated troponin. Again this is not thought to be ACS. Patient will continue outpatient medications. I suspect this is just demand ischemia from her lotion be edema and generalized weakness.  3. Lower  extremity edema: Patient underwent an echocardiogram which showed no evidence of systolic or diastolic dysfunction. Her TSH was normal. Patient is on Norvasc which I discontinued as this medication can continue to lower extremity edema. Patient may just have poor venous circulation.  4. Accelerated hypertension: Patient's blood pressure is now better controlled as she received her outpatient education. Patient will need ongoing management for hypertension.  5. Diabetes type 2: Patient will continue her outpatient medications. 6. Coronary artery disease: I appreciate cardiology consultation. Patient continue outpatient medications including aspirin, statin, and the Toprol.   DISCHARGE CONDITIONS AND DIET:  Patient will be discharged home with home health care Diabetic/heart healthy diet  CONSULTS OBTAINED:  Treatment Team:  Corey Skains, MD  DRUG ALLERGIES:   Allergies  Allergen Reactions  . Boniva [Ibandronic Acid] Other (See Comments)    Reaction:  GI upset    DISCHARGE MEDICATIONS:   Current Discharge Medication List    CONTINUE these medications which have NOT CHANGED   Details  colestipol (COLESTID) 1 G tablet Take 1 g by mouth 2 (two) times daily.    diphenoxylate-atropine (LOMOTIL) 2.5-0.025 MG per tablet Take 2 tablets by mouth 4 (four) times daily as needed for diarrhea or loose stools.     Fluticasone-Salmeterol (ADVAIR) 250-50 MCG/DOSE AEPB Inhale 1 puff into the lungs 2 (two) times daily.    lovastatin (MEVACOR) 20 MG tablet Take 20 mg by mouth daily.     metoprolol succinate (TOPROL-XL) 25 MG 24 hr tablet Take 25 mg by mouth daily.    pantoprazole (PROTONIX) 40 MG tablet Take 40 mg by  mouth daily.    sitaGLIPtin (JANUVIA) 50 MG tablet Take 50 mg by mouth daily.    torsemide (DEMADEX) 10 MG tablet Take 10 mg by mouth daily.    traZODone (DESYREL) 50 MG tablet Take 50 mg by mouth at bedtime.      STOP taking these medications     ferrous sulfate 325  (65 FE) MG tablet               Today   CHIEF COMPLAINT:  Patient is doing well today. Her lower extremity edema is improved. She wants to go home.   VITAL SIGNS:  Blood pressure 131/71, pulse 81, temperature 98.5 F (36.9 C), temperature source Oral, resp. rate 16, height 5\' 2"  (1.575 m), weight 67.495 kg (148 lb 12.8 oz), SpO2 98 %.   REVIEW OF SYSTEMS:  Review of Systems  Constitutional: Negative for chills.  Respiratory: Negative for shortness of breath.   Cardiovascular: Negative for chest pain, palpitations, orthopnea and claudication. Leg swelling: improved.  Gastrointestinal: Negative for vomiting, abdominal pain and diarrhea.  Genitourinary: Negative for dysuria, frequency, hematuria and flank pain.  Skin: Negative for rash.     PHYSICAL EXAMINATION:  GENERAL:  81 y.o.-year-old patient lying in the bed with no acute distress.  NECK:  Supple, no jugular venous distention. No thyroid enlargement, no tenderness.  LUNGS: Normal breath sounds bilaterally, no wheezing, rales,rhonchi  No use of accessory muscles of respiration.  CARDIOVASCULAR: S1, S2 normal. No murmurs, rubs, or gallops.  ABDOMEN: Soft, non-tender, non-distended. Bowel sounds present. No organomegaly or mass.  EXTREMITIES: she is 2+ edema. Her swelling is improved.Marland Kitchen  PSYCHIATRIC: The patient is alert and oriented x 3.  SKIN: No obvious rash, lesion, or ulcer.   DATA REVIEW:   CBC  Recent Labs Lab 05/24/14 2241  WBC 6.8  HGB 9.5*  HCT 29.9*  PLT 307    Chemistries   Recent Labs Lab 05/26/14 0346  NA 139  K 3.2*  CL 104  CO2 29  GLUCOSE 97  BUN 17  CREATININE 1.58*  CALCIUM 7.2*    Cardiac Enzymes  Recent Labs Lab 05/24/14 2241 05/25/14 1438 05/25/14 2046  TROPONINI 0.04* 0.03 <0.03    Microbiology Results  @[email or web address] @  RADIOLOGY:  US Venous Img Lower Bilateral  05/26/2014   CLINICAL DATA:  Edema  EXAM: BILATERAL LOWER EXTREMITY VENOUS DUPLEX ULTRASOUND   TECHNIQUE: Doppler venous assessment of the bilateral lower extremity deep venous system was performed, including characterization of spectral flow, compressibility, and phasicity.  COMPARISON:  05/01/2013  FINDINGS: There is complete compressibility of the bilateral common femoral, femoral, and popliteal veins. Doppler analysis demonstrates respiratory phasicity and augmentation of flow upon calf compression.  IMPRESSION: No evidence of lower extremity DVT.   Electronically Signed   By: Marybelle Killings M.D.   On: 05/26/2014 12:53   Dg Chest Portable 1 View  05/25/2014   CLINICAL DATA:  Nausea, diarrhea, weakness. Numbness and difficulty walking.  EXAM: PORTABLE CHEST - 1 VIEW  COMPARISON:  03/25/2014  FINDINGS: Lungs are hyperinflated with probable emphysema. Cardiomediastinal contours are unchanged, heart at the upper limits of normal in size. Pulmonary vasculature is normal. There is no consolidation, pleural effusion or pneumothorax. Degenerative change of the left shoulder with intra-articular bodies again seen.  IMPRESSION: No acute pulmonary process.   Electronically Signed   By: Jeb Levering M.D.   On: 05/25/2014 02:29      Management plans discussed with the patient and she is in  agreement. Stable for discharge home with home health care  Patient should follow up with PCP  CODE STATUS:     Code Status Orders        Start     Ordered   05/25/14 0842  Full code   Continuous     05/25/14 0841      TOTAL TIME TAKING CARE OF THIS PATIENT: 35 minutes.    Aqua Denslow M.D on 05/26/2014 at 1:21 PM  Between 7am to 6pm - Pager - [phone] After 6pm go to [email or web address] - password [email or web address] [facility]  [facility]  Office  [phone]  CC: Primary care physician; [facility], Chrissie Noa, MD

## 2014-05-28 DIAGNOSIS — E538 Deficiency of other specified B group vitamins: Secondary | ICD-10-CM | POA: Diagnosis not present

## 2014-06-03 DIAGNOSIS — E1122 Type 2 diabetes mellitus with diabetic chronic kidney disease: Secondary | ICD-10-CM | POA: Diagnosis not present

## 2014-06-03 DIAGNOSIS — N183 Chronic kidney disease, stage 3 (moderate): Secondary | ICD-10-CM | POA: Diagnosis not present

## 2014-06-03 DIAGNOSIS — R531 Weakness: Secondary | ICD-10-CM | POA: Diagnosis not present

## 2014-06-03 DIAGNOSIS — R600 Localized edema: Secondary | ICD-10-CM | POA: Diagnosis not present

## 2014-06-26 DIAGNOSIS — N2581 Secondary hyperparathyroidism of renal origin: Secondary | ICD-10-CM | POA: Diagnosis not present

## 2014-06-26 DIAGNOSIS — E1122 Type 2 diabetes mellitus with diabetic chronic kidney disease: Secondary | ICD-10-CM | POA: Diagnosis not present

## 2014-06-26 DIAGNOSIS — I129 Hypertensive chronic kidney disease with stage 1 through stage 4 chronic kidney disease, or unspecified chronic kidney disease: Secondary | ICD-10-CM | POA: Diagnosis not present

## 2014-06-26 DIAGNOSIS — D631 Anemia in chronic kidney disease: Secondary | ICD-10-CM | POA: Diagnosis not present

## 2014-06-26 DIAGNOSIS — N183 Chronic kidney disease, stage 3 (moderate): Secondary | ICD-10-CM | POA: Diagnosis not present

## 2014-06-29 DIAGNOSIS — D519 Vitamin B12 deficiency anemia, unspecified: Secondary | ICD-10-CM | POA: Diagnosis not present

## 2014-07-07 NOTE — Progress Notes (Signed)
05/25/14 1456  PT Visit Information  Last PT Received On 05/25/14  Assistance Needed +1  History of Present Illness Lisa Beard is a 81 y.o. female presented to the emergency room with the complaints of ongoing generalized weakness for the past few days, increasing leg swelling causing her difficulty in walking for the past few days. Denied any fever, cough, shortness of breath, chest pain. Does have chronic diarrhea, mild nausea present, no vomiting or abdominal pain. Denied any dysuria. In the emergency room patient was evaluated by the ED physician and found to be with stable vital signs and workup revealed mildly elevated troponin of 0.04. EKG normal sinus rhythm with ventricular rate of 67 bpm, T-wave inversions in lead 3 and V3. Rest of lab work stable CKD and anemia. Chest x-ray negative for any acute pathology. Hospitalist admitted patient and consulted cardiology who ruled out acute cardiac event. MD cleared pt for participation with physical therapy. Daughter is present with patient upon arrival and both parties deny any falls in the last 12 months.   Precautions  Precautions None  Restrictions  Weight Bearing Restrictions No  Home Living  Family/patient expects to be discharged to: Private residence  Living Arrangements Children (Lives with son. Has 24/7 assist at home)  Available Help at Discharge Family  Type of [place] to enter  Entrance Stairs-Number of Steps 4  Entrance Stairs-Rails Can reach both  [place] One level  [place] - 4 wheels;Cane - single point;Shower seat;Tub bench (no grab bars)  Additional Comments Sleeps in recliner  Prior Function  Level of Independence Independent  Comments Independent with ADLs. Assist with IADLs  Communication  Communication No difficulties  Pain Assessment  Pain Assessment 0-10  Pain Score 8  Pain Location Stomach (chronic prior to admission with meals) (RN present during patient pain  complaints)  Pain Intervention(s) Limited activity within patient's tolerance (Pt reports nausea which limits activity)  Cognition  Arousal/Alertness Awake/alert  Behavior During Therapy WFL for tasks assessed/performed  Overall Cognitive Status Within Functional Limits for tasks assessed  Upper Extremity Assessment  Upper Extremity Assessment Generalized weakness (Grossly 4-/5 througout UE)  Lower Extremity Assessment  Lower Extremity Assessment Generalized weakness (3+/5 hip flexion, otherwise 4- to 4/5 throughout)  Bed Mobility  Overal bed mobility Modified Independent  General bed mobility comments Use of bed rail  Transfers  Overall transfer level Modified independent (Needs UE support during transfer. Good hand placement)  Equipment used Straight cane  General transfer comment Pt with good stability and weight transfer  Ambulation/Gait  Ambulation/Gait assistance Min guard  Ambulation Distance (Feet) 40 Feet  Assistive device Straight cane  General Gait Details Decreased speed and step length. Pt with LOB during horizontal head turns with gait requiring therapist correction to prevent fall. Pt reports nausea during ambulation limiting participation  Gait Pattern/deviations Decreased step length - right;Decreased step length - left  Gait velocity Decreased  Gait velocity interpretation <1.8 ft/sec, indicative of risk for recurrent falls  Balance  Overall balance assessment Needs assistance  Sitting-balance support No upper extremity supported  Sitting balance-Leahy Scale Good  Standing balance support No upper extremity supported  Standing balance-Leahy Scale Fair  Single Leg Stance - Right Leg 1  Single Leg Stance - Left Leg 1  Rhomberg - Eyes Opened 30  Rhomberg - Eyes Closed 5  PT - End of Session  Equipment Utilized During Treatment Gait belt  Activity Tolerance Patient limited by fatigue (limited  by nausea)  Patient left in bed;with call bell/phone within  reach;with bed alarm set  Nurse Communication Other (comment) (Stool in hat for collection)  PT Assessment  PT Therapy Diagnosis  Difficulty walking;Generalized weakness  PT Recommendation/Assessment Patient needs continued PT services  PT Problem List Decreased strength;Decreased activity tolerance;Decreased balance;Decreased mobility;Decreased safety awareness  PT Plan  PT Frequency (ACUTE ONLY) Min 2X/week  PT Treatment/Interventions (ACUTE ONLY) Gait training;Stair training;Therapeutic activities;Therapeutic exercise;Balance training;Neuromuscular re-education;Patient/family education;DME instruction  PT Recommendation  Follow Up Recommendations Home health PT  PT equipment None recommended by PT (Pt encouraged to use rollator upon discharge)  Individuals Consulted  Consulted and Agree with Results and Recommendations Patient;Family member/caregiver  Family Member Consulted Daugther  Acute Rehab PT Goals  Patient Stated Goal "I want to go back home"  PT Goal Formulation With patient/family  Time For Goal Achievement 06/08/14  Potential to Achieve Goals Fair  PT Time Calculation  PT Start Time (ACUTE ONLY) 1330  PT Stop Time (ACUTE ONLY) 1355  PT Time Calculation (min) (ACUTE ONLY) 25 min  PT G-Codes **NOT FOR INPATIENT CLASS**  Functional Assessment Tool Used clinical judgement  Functional Limitation Mobility: Walking and moving around  Mobility: Walking and Moving Around Current Status (W4315) CJ  Mobility: Walking and Moving Around Goal Status (Q0086) CI  PT General Charges  $$ ACUTE PT VISIT 1 Procedure  PT Evaluation  $Initial PT Evaluation Tier I 1 Procedure  Written Expression  Dominant Hand Right   Late entry G-Codes entered by Juline Sanderford PT, DPT after chart review. Assessment performed and documented by Phillips Grout PT, DPT.

## 2014-07-17 DIAGNOSIS — R600 Localized edema: Secondary | ICD-10-CM | POA: Diagnosis not present

## 2014-07-17 DIAGNOSIS — R05 Cough: Secondary | ICD-10-CM | POA: Diagnosis not present

## 2014-07-17 DIAGNOSIS — J208 Acute bronchitis due to other specified organisms: Secondary | ICD-10-CM | POA: Diagnosis not present

## 2014-07-17 DIAGNOSIS — B9689 Other specified bacterial agents as the cause of diseases classified elsewhere: Secondary | ICD-10-CM | POA: Diagnosis not present

## 2014-07-26 ENCOUNTER — Emergency Department: Payer: Commercial Managed Care - HMO

## 2014-07-26 ENCOUNTER — Emergency Department
Admission: EM | Admit: 2014-07-26 | Discharge: 2014-07-26 | Disposition: A | Discharge status: Home / Self Care | Payer: Commercial Managed Care - HMO | Attending: Emergency Medicine | Admitting: Emergency Medicine

## 2014-07-26 ENCOUNTER — Other Ambulatory Visit: Payer: Self-pay

## 2014-07-26 ENCOUNTER — Encounter: Payer: Self-pay | Admitting: *Deleted

## 2014-07-26 DIAGNOSIS — M7989 Other specified soft tissue disorders: Secondary | ICD-10-CM | POA: Diagnosis not present

## 2014-07-26 DIAGNOSIS — Z79899 Other long term (current) drug therapy: Secondary | ICD-10-CM | POA: Diagnosis not present

## 2014-07-26 DIAGNOSIS — R609 Edema, unspecified: Secondary | ICD-10-CM | POA: Insufficient documentation

## 2014-07-26 DIAGNOSIS — I10 Essential (primary) hypertension: Secondary | ICD-10-CM | POA: Insufficient documentation

## 2014-07-26 DIAGNOSIS — R531 Weakness: Secondary | ICD-10-CM | POA: Insufficient documentation

## 2014-07-26 DIAGNOSIS — E119 Type 2 diabetes mellitus without complications: Secondary | ICD-10-CM | POA: Insufficient documentation

## 2014-07-26 DIAGNOSIS — Z7951 Long term (current) use of inhaled steroids: Secondary | ICD-10-CM | POA: Insufficient documentation

## 2014-07-26 DIAGNOSIS — R599 Enlarged lymph nodes, unspecified: Secondary | ICD-10-CM | POA: Diagnosis not present

## 2014-07-26 DIAGNOSIS — R0602 Shortness of breath: Secondary | ICD-10-CM | POA: Diagnosis not present

## 2014-07-26 DIAGNOSIS — J449 Chronic obstructive pulmonary disease, unspecified: Secondary | ICD-10-CM | POA: Diagnosis not present

## 2014-07-26 DIAGNOSIS — I890 Lymphedema, not elsewhere classified: Secondary | ICD-10-CM

## 2014-07-26 DIAGNOSIS — M79604 Pain in right leg: Secondary | ICD-10-CM | POA: Diagnosis not present

## 2014-07-26 DIAGNOSIS — R05 Cough: Secondary | ICD-10-CM | POA: Diagnosis not present

## 2014-07-26 DIAGNOSIS — M79605 Pain in left leg: Secondary | ICD-10-CM | POA: Diagnosis not present

## 2014-07-26 LAB — HEPATIC FUNCTION PANEL
ALK PHOS: 160 U/L — AB (ref 38–126)
ALT: 28 U/L (ref 14–54)
AST: 31 U/L (ref 15–41)
Albumin: 3 g/dL — ABNORMAL LOW (ref 3.5–5.0)
BILIRUBIN TOTAL: 0.3 mg/dL (ref 0.3–1.2)
Bilirubin, Direct: <0.1 mg/dL — ABNORMAL LOW (ref 0.1–0.5)
TOTAL PROTEIN: 6.4 g/dL — AB (ref 6.5–8.1)

## 2014-07-26 LAB — CBC
HEMATOCRIT: 32.1 % — AB (ref 35.0–47.0)
Hemoglobin: 10.7 g/dL — ABNORMAL LOW (ref 12.0–16.0)
MCH: 31.2 pg (ref 26.0–34.0)
MCHC: 33.2 g/dL (ref 32.0–36.0)
MCV: 94 fL (ref 80.0–100.0)
Platelets: 389 10*3/uL (ref 150–440)
RBC: 3.41 MIL/uL — ABNORMAL LOW (ref 3.80–5.20)
RDW: 13.8 % (ref 11.5–14.5)
WBC: 9 10*3/uL (ref 3.6–11.0)

## 2014-07-26 LAB — BASIC METABOLIC PANEL
Anion gap: 8 (ref 5–15)
BUN: 16 mg/dL (ref 6–20)
CHLORIDE: 101 mmol/L (ref 101–111)
CO2: 27 mmol/L (ref 22–32)
Calcium: 7.3 mg/dL — ABNORMAL LOW (ref 8.9–10.3)
Creatinine, Ser: 1.44 mg/dL — ABNORMAL HIGH (ref 0.44–1.00)
GFR calc Af Amer: 37 mL/min — ABNORMAL LOW (ref >60)
GFR calc non Af Amer: 32 mL/min — ABNORMAL LOW (ref >60)
GLUCOSE: 96 mg/dL (ref 65–99)
POTASSIUM: 3.7 mmol/L (ref 3.5–5.1)
Sodium: 136 mmol/L (ref 135–145)

## 2014-07-26 LAB — LIPASE, BLOOD: LIPASE: 11 U/L — AB (ref 22–51)

## 2014-07-26 LAB — TROPONIN I

## 2014-07-26 NOTE — ED Notes (Signed)
Pt to ultrasound via strecher with ultrasound tech.

## 2014-07-26 NOTE — ED Provider Notes (Signed)
[facility] Emergency Department Provider Note  ____________________________________________  Time seen: 4:05 AM  I have reviewed the triage vital signs and the nursing notes.   HISTORY  Chief Complaint Weakness and Shortness of Breath     HPI Lisa Beard is a 81 y.o. female reports that she is worried about her breathing. She says she feels weak in the chest. She was recently diagnosed with bronchitis and placed on antibiotics one week ago. She took her last pill yesterday. Her breathing has not gotten any worse but she appears more anxious about it. She denies any chest pain.  The patient does have swelling of her legs. This is been going on for couple months. She takes a diuretic for this. The patient and the family are concerned because the diuretic does not appear to be reducing the size of her legs.  She does not have a history of any blood clots. The family tells me that there was no ultrasound done at the onset of the symptoms.    Past Medical History  Diagnosis Date  . Coronary artery disease   . Hypertension   . COPD (chronic obstructive pulmonary disease)   . Asthma   . Diabetes mellitus without complication   . Chronic kidney disease   . GERD (gastroesophageal reflux disease)   . Arthritis   . Anemia     Patient Active Problem List   Diagnosis Date Noted  . General weakness 05/25/2014  . Elevated troponin 05/25/2014  . CAD (coronary artery disease) 05/25/2014  . HTN (hypertension) 05/25/2014  . DM (diabetes mellitus) 05/25/2014  . COPD (chronic obstructive pulmonary disease) 05/25/2014  . CKD (chronic kidney disease) 05/25/2014  . Chronic anemia 05/25/2014    Past Surgical History  Procedure Laterality Date  . Eye surgery    . Cholecystectomy    . Abdominal hysterectomy      Current Outpatient Rx  Name  Route  Sig  Dispense  Refill  . colestipol (COLESTID) 1 G tablet   Oral   Take 1 g by mouth 2 (two) times daily.         . diphenoxylate-atropine (LOMOTIL) 2.5-0.025 MG per tablet   Oral   Take 2 tablets by mouth 4 (four) times daily as needed for diarrhea or loose stools.          . Fluticasone-Salmeterol (ADVAIR) 250-50 MCG/DOSE AEPB   Inhalation   Inhale 1 puff into the lungs 2 (two) times daily.         Marland Kitchen lovastatin (MEVACOR) 20 MG tablet   Oral   Take 20 mg by mouth daily.          . metoprolol succinate (TOPROL-XL) 25 MG 24 hr tablet   Oral   Take 25 mg by mouth daily.         . pantoprazole (PROTONIX) 40 MG tablet   Oral   Take 40 mg by mouth daily.         . sitaGLIPtin (JANUVIA) 50 MG tablet   Oral   Take 50 mg by mouth daily.         Marland Kitchen torsemide (DEMADEX) 10 MG tablet   Oral   Take 10 mg by mouth 2 (two) times daily.          . traZODone (DESYREL) 50 MG tablet   Oral   Take 50 mg by mouth at bedtime.           Allergies Boniva  Family History  Problem Relation Age of Onset  . CAD Mother   . Cancer Sister   . Hypertension Sister     Social History History  Substance Use Topics  . Smoking status: Never Smoker   . Smokeless tobacco: Not on file  . Alcohol Use: No    Review of Systems  Constitutional: Negative for fever. ENT: Negative for sore throat. Cardiovascular: Negative for chest pain. Respiratory: Subjective shortness of breath with "weakness in the chest" Gastrointestinal: Negative for abdominal pain, vomiting and diarrhea. Genitourinary: Negative for dysuria. Musculoskeletal: No myalgias or injuries. Swelling of both legs. Skin: Negative for rash. Neurological: Negative for headaches   10-point ROS otherwise negative.  ____________________________________________   PHYSICAL EXAM:  VITAL SIGNS: ED Triage Vitals  Enc Vitals Group     BP 07/26/14 0114 162/73 mmHg     Pulse Rate 07/26/14 0114 78     Resp 07/26/14 0114 20     Temp 07/26/14 0114 98.1 F (36.7 C)     Temp Source 07/26/14 0114 Oral     SpO2 07/26/14 0114 100 %      Weight 07/26/14 0114 169 lb 3.2 oz (76.749 kg)     Height 07/26/14 0114 5\' 2"  (1.575 m)     Head Cir --      Peak Flow --      Pain Score 07/26/14 0123 6     Pain Loc --      Pain Edu? --      Excl. in [place]? --     Constitutional:  Alert, communicative, no acute distress. Slightly anxious. ENT   Head: Normocephalic and atraumatic.   Nose: No congestion/rhinnorhea.   Mouth/Throat: Mucous membranes are moist. Cardiovascular: Normal rate, regular rhythm, no murmur noted Respiratory:  Normal respiratory effort, no tachypnea.    Breath sounds are clear and equal bilaterally.  Gastrointestinal: Soft and nontender. No distention.  Back: No muscle spasm, no tenderness, no CVA tenderness. Musculoskeletal: No deformity noted. Nontender with normal range of motion in all extremities. Notable swelling of both legs with 2+ pitting edema. Neurologic:  Normal speech and language. No gross focal neurologic deficits are appreciated.  Skin:  Skin is warm, dry. There is a mild erythema along immediate low portions of both thighs where they contact each other. Psychiatric: Mood and affect are normal. Speech and behavior are normal.  ____________________________________________    LABS (pertinent positives/negatives)  Labs Reviewed  BASIC METABOLIC PANEL - Abnormal; Notable for the following:    Creatinine, Ser 1.44 (*)    Calcium 7.3 (*)    GFR calc non Af Amer 32 (*)    GFR calc Af Amer 37 (*)    All other components within normal limits  CBC - Abnormal; Notable for the following:    RBC 3.41 (*)    Hemoglobin 10.7 (*)    HCT 32.1 (*)    All other components within normal limits  LIPASE, BLOOD - Abnormal; Notable for the following:    Lipase 11 (*)    All other components within normal limits  HEPATIC FUNCTION PANEL - Abnormal; Notable for the following:    Total Protein 6.4 (*)    Albumin 3.0 (*)    Alkaline Phosphatase 160 (*)    Bilirubin, Direct <0.1 (*)    All other  components within normal limits  TROPONIN I     ____________________________________________   EKG  ED ECG REPORT I, Latesa Fratto W, the attending physician, personally viewed and interpreted this ECG.  Date: 07/26/2014  EKG Time: 1:37 AM  Rate: 76  Rhythm: Normal sinus rhythm with an incomplete right bundle branch block and a left anterior fascicular block.  Axis: Normal  Intervals: QTC of 481  ST&T Change: Downward T-wave in lead 3     ____________________________________________    RADIOLOGY  Chest x-ray IMPRESSION: Mild cardiomegaly and COPD ; no superimposed acute pulmonary Process.  Doppler bilateral lower extremities  IMPRESSION: No evidence of deep venous thrombosis.  ____________________________________________  ____________________________________________   INITIAL IMPRESSION / ASSESSMENT AND PLAN / ED COURSE  Pertinent labs & imaging results that were available during my care of the patient were reviewed by me and considered in my medical decision making (see chart for details).  Pleasant but a little bit nervous 81 year old female. She reports concern for her breathing, but does not show any respiratory distress or dysfunction. Her lungs are clear, her oxygen saturation level is good. A chest x-ray performed tonight does not show any focal infiltrate.  The patient's legs are notably swollen with some mild erythema up along the medial thighs. This is likely due to chafing, however she may have a phlebitis. We will get an ultrasound to be sure that she does not have a DVT, as none has been done so far to evaluate this possibility.  ----------------------------------------- 6:50 AM on 07/26/2014 -----------------------------------------  The ultrasound of the lower sternum and is negative for DVT.    Patient has been breathing comfortably throughout my evaluation in the emergency department.  The patient appears to be doing well currently. I have  counseled her and her family on the results. She will follow-up with her primary physician, Dr. Ellison Hughs ____________________________________________   FINAL CLINICAL IMPRESSION(S) / ED DIAGNOSES  Final diagnoses:  General weakness  Shortness of breath  Lymphedema      Ahmed Prima, MD 07/26/14 [phone]

## 2014-07-26 NOTE — ED Notes (Signed)
Pt has multiple medical complaints. Pt recently treated w/ abx for a lung infection. Pt c/o "weakness in chest". Pt also c/o dyspnea that has persisted beyond the abx treatment for the past week. Pt denies n/v and dysuria. Pt recently had increase to her diuretic in past week w/o decreased bilateral lower extremity edema.

## 2014-07-26 NOTE — Discharge Instructions (Signed)
Your ultrasound was negative for blood clots in your legs. Your blood tests overall looked okay. Continue your current medications. Follow-up with her regular doctor later this week.  Edema Edema is an abnormal buildup of fluids in your bodytissues. Edema is somewhatdependent on gravity to pull the fluid to the lowest place in your body. That makes the condition more common in the legs and thighs (lower extremities). Painless swelling of the feet and ankles is common and becomes more likely as you get older. It is also common in looser tissues, like around your eyes.  When the affected area is squeezed, the fluid may move out of that spot and leave a dent for a few moments. This dent is called pitting.  CAUSES  There are many possible causes of edema. Eating too much salt and being on your feet or sitting for a long time can cause edema in your legs and ankles. Hot weather may make edema worse. Common medical causes of edema include:  Heart failure.  Liver disease.  Kidney disease.  Weak blood vessels in your legs.  Cancer.  An injury.  Pregnancy.  Some medications.  Obesity. SYMPTOMS  Edema is usually painless.Your skin may look swollen or shiny.  DIAGNOSIS  Your health care provider may be able to diagnose edema by asking about your medical history and doing a physical exam. You may need to have tests such as X-rays, an electrocardiogram, or blood tests to check for medical conditions that may cause edema.  TREATMENT  Edema treatment depends on the cause. If you have heart, liver, or kidney disease, you need the treatment appropriate for these conditions. General treatment may include:  Elevation of the affected body part above the level of your heart.  Compression of the affected body part. Pressure from elastic bandages or support stockings squeezes the tissues and forces fluid back into the blood vessels. This keeps fluid from entering the tissues.  Restriction of fluid  and salt intake.  Use of a water pill (diuretic). These medications are appropriate only for some types of edema. They pull fluid out of your body and make you urinate more often. This gets rid of fluid and reduces swelling, but diuretics can have side effects. Only use diuretics as directed by your health care provider. HOME CARE INSTRUCTIONS   Keep the affected body part above the level of your heart when you are lying down.   Do not sit still or stand for prolonged periods.   Do not put anything directly under your knees when lying down.  Do not wear constricting clothing or garters on your upper legs.   Exercise your legs to work the fluid back into your blood vessels. This may help the swelling go down.   Wear elastic bandages or support stockings to reduce ankle swelling as directed by your health care provider.   Eat a low-salt diet to reduce fluid if your health care provider recommends it.   Only take medicines as directed by your health care provider. SEEK MEDICAL CARE IF:   Your edema is not responding to treatment.  You have heart, liver, or kidney disease and notice symptoms of edema.  You have edema in your legs that does not improve after elevating them.   You have sudden and unexplained weight gain. SEEK IMMEDIATE MEDICAL CARE IF:   You develop shortness of breath or chest pain.   You cannot breathe when you lie down.  You develop pain, redness, or warmth in the  swollen areas.   You have heart, liver, or kidney disease and suddenly get edema.  You have a fever and your symptoms suddenly get worse. MAKE SURE YOU:   Understand these instructions.  Will watch your condition.  Will get help right away if you are not doing well or get worse. Document Released: 12/26/2004 Document Revised: 05/12/2013 Document Reviewed: 10/18/2012 [facility] Patient Information 2015 [place], [place]. This information is not intended to replace advice given to you by  your health care provider. Make sure you discuss any questions you have with your health care provider.

## 2014-07-26 NOTE — ED Notes (Signed)
Pt returned to room from ultrasound.

## 2014-07-26 NOTE — ED Notes (Signed)
Pt discharged home after verbalizing understanding of discharge instructions; nad noted. 

## 2014-07-27 DIAGNOSIS — I10 Essential (primary) hypertension: Secondary | ICD-10-CM | POA: Diagnosis not present

## 2014-07-27 DIAGNOSIS — D631 Anemia in chronic kidney disease: Secondary | ICD-10-CM | POA: Diagnosis not present

## 2014-07-27 DIAGNOSIS — N2581 Secondary hyperparathyroidism of renal origin: Secondary | ICD-10-CM | POA: Diagnosis not present

## 2014-07-27 DIAGNOSIS — E1122 Type 2 diabetes mellitus with diabetic chronic kidney disease: Secondary | ICD-10-CM | POA: Diagnosis not present

## 2014-07-27 DIAGNOSIS — N183 Chronic kidney disease, stage 3 (moderate): Secondary | ICD-10-CM | POA: Diagnosis not present

## 2014-07-30 DIAGNOSIS — E538 Deficiency of other specified B group vitamins: Secondary | ICD-10-CM | POA: Diagnosis not present

## 2014-08-03 DIAGNOSIS — E1122 Type 2 diabetes mellitus with diabetic chronic kidney disease: Secondary | ICD-10-CM | POA: Diagnosis not present

## 2014-08-03 DIAGNOSIS — I10 Essential (primary) hypertension: Secondary | ICD-10-CM | POA: Diagnosis not present

## 2014-08-03 DIAGNOSIS — J449 Chronic obstructive pulmonary disease, unspecified: Secondary | ICD-10-CM | POA: Diagnosis not present

## 2014-08-03 DIAGNOSIS — I2510 Atherosclerotic heart disease of native coronary artery without angina pectoris: Secondary | ICD-10-CM | POA: Diagnosis not present

## 2014-08-13 DIAGNOSIS — N184 Chronic kidney disease, stage 4 (severe): Secondary | ICD-10-CM | POA: Diagnosis not present

## 2014-08-13 DIAGNOSIS — D649 Anemia, unspecified: Secondary | ICD-10-CM | POA: Diagnosis not present

## 2014-08-13 DIAGNOSIS — E1122 Type 2 diabetes mellitus with diabetic chronic kidney disease: Secondary | ICD-10-CM | POA: Diagnosis not present

## 2014-08-13 DIAGNOSIS — I10 Essential (primary) hypertension: Secondary | ICD-10-CM | POA: Diagnosis not present

## 2014-08-13 DIAGNOSIS — K219 Gastro-esophageal reflux disease without esophagitis: Secondary | ICD-10-CM | POA: Diagnosis not present

## 2014-08-13 DIAGNOSIS — E780 Pure hypercholesterolemia: Secondary | ICD-10-CM | POA: Diagnosis not present

## 2014-09-02 DIAGNOSIS — E538 Deficiency of other specified B group vitamins: Secondary | ICD-10-CM | POA: Diagnosis not present

## 2014-09-28 DIAGNOSIS — E1122 Type 2 diabetes mellitus with diabetic chronic kidney disease: Secondary | ICD-10-CM | POA: Diagnosis not present

## 2014-09-28 DIAGNOSIS — N2581 Secondary hyperparathyroidism of renal origin: Secondary | ICD-10-CM | POA: Diagnosis not present

## 2014-09-28 DIAGNOSIS — D631 Anemia in chronic kidney disease: Secondary | ICD-10-CM | POA: Diagnosis not present

## 2014-09-28 DIAGNOSIS — N183 Chronic kidney disease, stage 3 (moderate): Secondary | ICD-10-CM | POA: Diagnosis not present

## 2014-09-28 DIAGNOSIS — R809 Proteinuria, unspecified: Secondary | ICD-10-CM | POA: Diagnosis not present

## 2014-09-28 DIAGNOSIS — I10 Essential (primary) hypertension: Secondary | ICD-10-CM | POA: Diagnosis not present

## 2014-09-30 DIAGNOSIS — D518 Other vitamin B12 deficiency anemias: Secondary | ICD-10-CM | POA: Diagnosis not present

## 2014-10-25 ENCOUNTER — Encounter: Payer: Self-pay | Admitting: Internal Medicine

## 2014-10-25 ENCOUNTER — Observation Stay
Admission: EM | Admit: 2014-10-25 | Discharge: 2014-10-27 | Disposition: A | Discharge status: Home / Self Care | Payer: Commercial Managed Care - HMO | Attending: Internal Medicine | Admitting: Internal Medicine

## 2014-10-25 ENCOUNTER — Emergency Department: Payer: Commercial Managed Care - HMO

## 2014-10-25 DIAGNOSIS — F329 Major depressive disorder, single episode, unspecified: Secondary | ICD-10-CM | POA: Insufficient documentation

## 2014-10-25 DIAGNOSIS — I452 Bifascicular block: Secondary | ICD-10-CM | POA: Insufficient documentation

## 2014-10-25 DIAGNOSIS — I7300 Raynaud's syndrome without gangrene: Secondary | ICD-10-CM | POA: Insufficient documentation

## 2014-10-25 DIAGNOSIS — D72829 Elevated white blood cell count, unspecified: Secondary | ICD-10-CM | POA: Diagnosis not present

## 2014-10-25 DIAGNOSIS — K449 Diaphragmatic hernia without obstruction or gangrene: Secondary | ICD-10-CM | POA: Diagnosis not present

## 2014-10-25 DIAGNOSIS — Z79899 Other long term (current) drug therapy: Secondary | ICD-10-CM | POA: Insufficient documentation

## 2014-10-25 DIAGNOSIS — Z23 Encounter for immunization: Secondary | ICD-10-CM | POA: Insufficient documentation

## 2014-10-25 DIAGNOSIS — E538 Deficiency of other specified B group vitamins: Secondary | ICD-10-CM | POA: Insufficient documentation

## 2014-10-25 DIAGNOSIS — M25512 Pain in left shoulder: Secondary | ICD-10-CM | POA: Diagnosis not present

## 2014-10-25 DIAGNOSIS — N189 Chronic kidney disease, unspecified: Secondary | ICD-10-CM | POA: Diagnosis not present

## 2014-10-25 DIAGNOSIS — E7800 Pure hypercholesterolemia, unspecified: Secondary | ICD-10-CM | POA: Insufficient documentation

## 2014-10-25 DIAGNOSIS — R079 Chest pain, unspecified: Secondary | ICD-10-CM | POA: Diagnosis present

## 2014-10-25 DIAGNOSIS — Z86010 Personal history of colonic polyps: Secondary | ICD-10-CM | POA: Diagnosis not present

## 2014-10-25 DIAGNOSIS — R7989 Other specified abnormal findings of blood chemistry: Secondary | ICD-10-CM | POA: Diagnosis not present

## 2014-10-25 DIAGNOSIS — I252 Old myocardial infarction: Secondary | ICD-10-CM | POA: Insufficient documentation

## 2014-10-25 DIAGNOSIS — D649 Anemia, unspecified: Secondary | ICD-10-CM | POA: Diagnosis not present

## 2014-10-25 DIAGNOSIS — E1122 Type 2 diabetes mellitus with diabetic chronic kidney disease: Secondary | ICD-10-CM | POA: Insufficient documentation

## 2014-10-25 DIAGNOSIS — R0789 Other chest pain: Secondary | ICD-10-CM | POA: Diagnosis not present

## 2014-10-25 DIAGNOSIS — E785 Hyperlipidemia, unspecified: Secondary | ICD-10-CM | POA: Insufficient documentation

## 2014-10-25 DIAGNOSIS — Z8249 Family history of ischemic heart disease and other diseases of the circulatory system: Secondary | ICD-10-CM | POA: Insufficient documentation

## 2014-10-25 DIAGNOSIS — K219 Gastro-esophageal reflux disease without esophagitis: Secondary | ICD-10-CM | POA: Insufficient documentation

## 2014-10-25 DIAGNOSIS — R9431 Abnormal electrocardiogram [ECG] [EKG]: Secondary | ICD-10-CM | POA: Diagnosis not present

## 2014-10-25 DIAGNOSIS — R0602 Shortness of breath: Secondary | ICD-10-CM | POA: Diagnosis not present

## 2014-10-25 DIAGNOSIS — I4510 Unspecified right bundle-branch block: Secondary | ICD-10-CM | POA: Insufficient documentation

## 2014-10-25 DIAGNOSIS — I272 Other secondary pulmonary hypertension: Secondary | ICD-10-CM | POA: Insufficient documentation

## 2014-10-25 DIAGNOSIS — R531 Weakness: Secondary | ICD-10-CM | POA: Insufficient documentation

## 2014-10-25 DIAGNOSIS — R0600 Dyspnea, unspecified: Secondary | ICD-10-CM

## 2014-10-25 DIAGNOSIS — R32 Unspecified urinary incontinence: Secondary | ICD-10-CM | POA: Insufficient documentation

## 2014-10-25 DIAGNOSIS — Z7982 Long term (current) use of aspirin: Secondary | ICD-10-CM | POA: Diagnosis not present

## 2014-10-25 DIAGNOSIS — R1310 Dysphagia, unspecified: Secondary | ICD-10-CM | POA: Insufficient documentation

## 2014-10-25 DIAGNOSIS — I2510 Atherosclerotic heart disease of native coronary artery without angina pectoris: Secondary | ICD-10-CM | POA: Diagnosis not present

## 2014-10-25 DIAGNOSIS — J449 Chronic obstructive pulmonary disease, unspecified: Secondary | ICD-10-CM | POA: Diagnosis not present

## 2014-10-25 DIAGNOSIS — R748 Abnormal levels of other serum enzymes: Secondary | ICD-10-CM | POA: Diagnosis not present

## 2014-10-25 DIAGNOSIS — Z8679 Personal history of other diseases of the circulatory system: Secondary | ICD-10-CM | POA: Diagnosis not present

## 2014-10-25 DIAGNOSIS — I1310 Hypertensive heart and chronic kidney disease without heart failure, with stage 1 through stage 4 chronic kidney disease, or unspecified chronic kidney disease: Secondary | ICD-10-CM | POA: Diagnosis not present

## 2014-10-25 DIAGNOSIS — E871 Hypo-osmolality and hyponatremia: Secondary | ICD-10-CM | POA: Insufficient documentation

## 2014-10-25 DIAGNOSIS — Z888 Allergy status to other drugs, medicaments and biological substances status: Secondary | ICD-10-CM | POA: Diagnosis not present

## 2014-10-25 LAB — BASIC METABOLIC PANEL
ANION GAP: 8 (ref 5–15)
BUN: 17 mg/dL (ref 6–20)
CO2: 27 mmol/L (ref 22–32)
CREATININE: 1.51 mg/dL — AB (ref 0.44–1.00)
Calcium: 8 mg/dL — ABNORMAL LOW (ref 8.9–10.3)
Chloride: 98 mmol/L — ABNORMAL LOW (ref 101–111)
GFR calc non Af Amer: 30 mL/min — ABNORMAL LOW (ref >60)
GFR, EST AFRICAN AMERICAN: 35 mL/min — AB (ref >60)
GLUCOSE: 218 mg/dL — AB (ref 65–99)
Potassium: 3.9 mmol/L (ref 3.5–5.1)
Sodium: 133 mmol/L — ABNORMAL LOW (ref 135–145)

## 2014-10-25 LAB — CBC WITH DIFFERENTIAL/PLATELET
BASOS ABS: 0 10*3/uL (ref 0–0.1)
BASOS PCT: 0 %
EOS PCT: 1 %
Eosinophils Absolute: 0.1 10*3/uL (ref 0–0.7)
HEMATOCRIT: 30.7 % — AB (ref 35.0–47.0)
Hemoglobin: 10.2 g/dL — ABNORMAL LOW (ref 12.0–16.0)
Lymphocytes Relative: 6 %
Lymphs Abs: 0.7 10*3/uL — ABNORMAL LOW (ref 1.0–3.6)
MCH: 30.5 pg (ref 26.0–34.0)
MCHC: 33.2 g/dL (ref 32.0–36.0)
MCV: 91.9 fL (ref 80.0–100.0)
MONO ABS: 0.8 10*3/uL (ref 0.2–0.9)
MONOS PCT: 6 %
NEUTROS ABS: 11 10*3/uL — AB (ref 1.4–6.5)
Neutrophils Relative %: 87 %
PLATELETS: 322 10*3/uL (ref 150–440)
RBC: 3.34 MIL/uL — ABNORMAL LOW (ref 3.80–5.20)
RDW: 14 % (ref 11.5–14.5)
WBC: 12.7 10*3/uL — ABNORMAL HIGH (ref 3.6–11.0)

## 2014-10-25 LAB — TROPONIN I
Troponin I: 0.04 ng/mL — ABNORMAL HIGH (ref <0.031)
Troponin I: 0.03 ng/mL (ref <0.031)

## 2014-10-25 MED ORDER — NITROGLYCERIN 0.4 MG SL SUBL
0.4000 mg | SUBLINGUAL_TABLET | SUBLINGUAL | Status: DC | PRN
  Administered 2014-10-25: 0.4 mg via SUBLINGUAL
  Filled 2014-10-25: qty 1

## 2014-10-25 MED ORDER — HYDROCODONE-ACETAMINOPHEN 5-325 MG PO TABS: 1.0000 | ORAL_TABLET | ORAL | Status: DC | PRN

## 2014-10-25 MED ORDER — METOPROLOL SUCCINATE ER 25 MG PO TB24
25.0000 mg | ORAL_TABLET | Freq: Every day | ORAL | Status: DC
  Administered 2014-10-26 – 2014-10-27 (×2): 25 mg via ORAL
  Filled 2014-10-25 (×2): qty 1

## 2014-10-25 MED ORDER — DIPHENOXYLATE-ATROPINE 2.5-0.025 MG PO TABS: 2.0000 | ORAL_TABLET | Freq: Four times a day (QID) | ORAL | Status: DC | PRN

## 2014-10-25 MED ORDER — BISACODYL 10 MG RE SUPP: 10.0000 mg | Freq: Every day | RECTAL | Status: DC | PRN

## 2014-10-25 MED ORDER — INFLUENZA VAC SPLIT QUAD 0.5 ML IM SUSY
0.5000 mL | PREFILLED_SYRINGE | INTRAMUSCULAR | Status: AC
  Administered 2014-10-26: 0.5 mL via INTRAMUSCULAR
  Filled 2014-10-25: qty 0.5

## 2014-10-25 MED ORDER — MOMETASONE FURO-FORMOTEROL FUM 100-5 MCG/ACT IN AERO
2.0000 | INHALATION_SPRAY | Freq: Two times a day (BID) | RESPIRATORY_TRACT | Status: DC
  Administered 2014-10-25 – 2014-10-27 (×4): 2 via RESPIRATORY_TRACT
  Filled 2014-10-25: qty 8.8

## 2014-10-25 MED ORDER — ASPIRIN 81 MG PO CHEW
162.0000 mg | CHEWABLE_TABLET | Freq: Once | ORAL | Status: AC
  Administered 2014-10-25: 162 mg via ORAL
  Filled 2014-10-25: qty 2

## 2014-10-25 MED ORDER — PRAVASTATIN SODIUM 20 MG PO TABS
20.0000 mg | ORAL_TABLET | Freq: Every day | ORAL | Status: DC
  Administered 2014-10-26: 20 mg via ORAL
  Filled 2014-10-25: qty 1

## 2014-10-25 MED ORDER — DOCUSATE SODIUM 100 MG PO CAPS
100.0000 mg | ORAL_CAPSULE | Freq: Two times a day (BID) | ORAL | Status: DC
  Administered 2014-10-25 – 2014-10-26 (×2): 100 mg via ORAL
  Filled 2014-10-25 (×5): qty 1

## 2014-10-25 MED ORDER — HEPARIN SODIUM (PORCINE) 5000 UNIT/ML IJ SOLN
5000.0000 [IU] | Freq: Three times a day (TID) | INTRAMUSCULAR | Status: DC
  Administered 2014-10-25 – 2014-10-27 (×5): 5000 [IU] via SUBCUTANEOUS
  Filled 2014-10-25 (×5): qty 1

## 2014-10-25 MED ORDER — PANTOPRAZOLE SODIUM 40 MG PO TBEC
40.0000 mg | DELAYED_RELEASE_TABLET | Freq: Every day | ORAL | Status: DC
  Administered 2014-10-26 – 2014-10-27 (×2): 40 mg via ORAL
  Filled 2014-10-25 (×2): qty 1

## 2014-10-25 MED ORDER — TORSEMIDE 5 MG PO TABS
10.0000 mg | ORAL_TABLET | Freq: Two times a day (BID) | ORAL | Status: DC
  Administered 2014-10-25 – 2014-10-27 (×4): 10 mg via ORAL
  Filled 2014-10-25 (×4): qty 2

## 2014-10-25 MED ORDER — LINAGLIPTIN 5 MG PO TABS
5.0000 mg | ORAL_TABLET | Freq: Every day | ORAL | Status: DC
  Administered 2014-10-26 – 2014-10-27 (×2): 5 mg via ORAL
  Filled 2014-10-25 (×2): qty 1

## 2014-10-25 MED ORDER — TRAZODONE HCL 50 MG PO TABS
50.0000 mg | ORAL_TABLET | Freq: Every day | ORAL | Status: DC
Start: 2014-10-25 — End: 2014-10-27
  Administered 2014-10-25 – 2014-10-26 (×2): 50 mg via ORAL
  Filled 2014-10-25 (×2): qty 1

## 2014-10-25 MED ORDER — SODIUM CHLORIDE 0.9 % IV SOLN
INTRAVENOUS | Status: DC
  Administered 2014-10-25: 18:00:00 via INTRAVENOUS

## 2014-10-25 MED ORDER — MORPHINE SULFATE (PF) 2 MG/ML IV SOLN: 2.0000 mg | INTRAVENOUS | Status: DC | PRN

## 2014-10-25 MED ORDER — ONDANSETRON HCL 4 MG PO TABS: 4.0000 mg | ORAL_TABLET | Freq: Four times a day (QID) | ORAL | Status: DC | PRN

## 2014-10-25 MED ORDER — COLESTIPOL HCL 1 G PO TABS
1.0000 g | ORAL_TABLET | Freq: Two times a day (BID) | ORAL | Status: DC
  Administered 2014-10-25 – 2014-10-27 (×3): 1 g via ORAL
  Filled 2014-10-25 (×6): qty 1

## 2014-10-25 MED ORDER — ACETAMINOPHEN 325 MG PO TABS: 650.0000 mg | ORAL_TABLET | Freq: Four times a day (QID) | ORAL | Status: DC | PRN

## 2014-10-25 MED ORDER — ONDANSETRON HCL 4 MG/2ML IJ SOLN: 4.0000 mg | Freq: Four times a day (QID) | INTRAMUSCULAR | Status: DC | PRN

## 2014-10-25 MED ORDER — SODIUM CHLORIDE 0.9 % IJ SOLN
3.0000 mL | Freq: Two times a day (BID) | INTRAMUSCULAR | Status: DC
  Administered 2014-10-25 – 2014-10-27 (×3): 3 mL via INTRAVENOUS

## 2014-10-25 MED ORDER — SPIRONOLACTONE 25 MG PO TABS
25.0000 mg | ORAL_TABLET | Freq: Every day | ORAL | Status: DC
  Administered 2014-10-26 – 2014-10-27 (×2): 25 mg via ORAL
  Filled 2014-10-25 (×2): qty 1

## 2014-10-25 MED ORDER — ACETAMINOPHEN 650 MG RE SUPP: 650.0000 mg | Freq: Four times a day (QID) | RECTAL | Status: DC | PRN

## 2014-10-25 MED ORDER — VITAMIN D 1000 UNITS PO TABS
1000.0000 [IU] | ORAL_TABLET | Freq: Every day | ORAL | Status: DC
  Administered 2014-10-26 – 2014-10-27 (×2): 1000 [IU] via ORAL
  Filled 2014-10-25 (×2): qty 1

## 2014-10-25 NOTE — H&P (Signed)
History and Physical    Lisa Beard QJJ:[id number] DOB: 27-Apr-1926 DOA: 10/25/2014  Referring physician: Dr. Clearnce Hasten PCP: [facility], Chrissie Noa, MD  Specialists: Dr. Ubaldo Glassing  Chief Complaint: Chest pain  HPI: Lisa Beard is a 81 y.o. female has a past medical history significant for ASCVD, HTN, and COPD now with >12 hour hx of left chest pain described as aching radiating to left arm. Pain is non-exertional but worse with movement. Some SOB. No N/V/D. EKG abnormal in ER and troponin mildly elevated. She is now admitted for further evaluation.  Review of Systems: The patient denies anorexia, fever, weight loss,, vision loss, decreased hearing, hoarseness, syncope,  peripheral edema, balance deficits, hemoptysis, abdominal pain, melena, hematochezia, severe indigestion/heartburn, hematuria, incontinence, genital sores, muscle weakness, suspicious skin lesions, transient blindness, difficulty walking, depression, unusual weight change, abnormal bleeding, enlarged lymph nodes, angioedema, and breast masses.   Past Medical History  Diagnosis Date  . Coronary artery disease   . Hypertension   . COPD (chronic obstructive pulmonary disease) ([place])   . Asthma   . Diabetes mellitus without complication ([place])   . Chronic kidney disease   . GERD (gastroesophageal reflux disease)   . Arthritis   . Anemia    Past Surgical History  Procedure Laterality Date  . Eye surgery    . Cholecystectomy    . Abdominal hysterectomy     Social History:  reports that she has never smoked. She does not have any smokeless tobacco history on file. She reports that she does not drink alcohol or use illicit drugs.  Allergies  Allergen Reactions  . Boniva [Ibandronic Acid] Other (See Comments)    Reaction:  GI upset    Family History  Problem Relation Age of Onset  . CAD Mother   . Cancer Sister   . Hypertension Sister     Prior to Admission medications   Medication Sig Start Date End Date Taking?  Authorizing Provider  colestipol (COLESTID) 1 G tablet Take 1 g by mouth 2 (two) times daily.    Historical Provider, MD  diphenoxylate-atropine (LOMOTIL) 2.5-0.025 MG per tablet Take 2 tablets by mouth 4 (four) times daily as needed for diarrhea or loose stools.     Historical Provider, MD  Fluticasone-Salmeterol (ADVAIR) 250-50 MCG/DOSE AEPB Inhale 1 puff into the lungs 2 (two) times daily.    Historical Provider, MD  lovastatin (MEVACOR) 20 MG tablet Take 20 mg by mouth daily.     Historical Provider, MD  metoprolol succinate (TOPROL-XL) 25 MG 24 hr tablet Take 25 mg by mouth daily.    Historical Provider, MD  pantoprazole (PROTONIX) 40 MG tablet Take 40 mg by mouth daily.    Historical Provider, MD  sitaGLIPtin (JANUVIA) 50 MG tablet Take 50 mg by mouth daily.    Historical Provider, MD  torsemide (DEMADEX) 10 MG tablet Take 10 mg by mouth 2 (two) times daily.     Historical Provider, MD  traZODone (DESYREL) 50 MG tablet Take 50 mg by mouth at bedtime.    Historical Provider, MD   Physical Exam: Filed Vitals:   10/25/14 1530 10/25/14 1600 10/25/14 1630 10/25/14 1701  BP: 154/72 179/91 184/77 141/72  Pulse: 73 71 70 62  Temp:      TempSrc:      Resp: 18 18 16 21   Height:      Weight:      SpO2: 97% 89% 97% 100%     General:  No apparent distress  Eyes: PERRL, EOMI, no scleral icterus  ENT: moist oropharynx  Neck: supple, no lymphadenopathy  Cardiovascular: regular rate without MRG; 2+ peripheral pulses, no JVD, 1+ peripheral edema  Respiratory: CTA biL, good air movement without wheezing, rhonchi or crackled  Abdomen: soft, non tender to palpation, positive bowel sounds, no guarding, no rebound  Skin: no rashes  Musculoskeletal: normal bulk and tone, no joint swelling  Psychiatric: normal mood and affect  Neurologic: CN 2-12 grossly intact, MS 5/5 in all 4  Labs on Admission:  Basic Metabolic Panel:  Recent Labs Lab 10/25/14 1521  NA 133*  K 3.9  CL 98*  CO2  27  GLUCOSE 218*  BUN 17  CREATININE 1.51*  CALCIUM 8.0*   Liver Function Tests: No results for input(s): AST, ALT, ALKPHOS, BILITOT, PROT, ALBUMIN in the last 168 hours. No results for input(s): LIPASE, AMYLASE in the last 168 hours. No results for input(s): AMMONIA in the last 168 hours. CBC:  Recent Labs Lab 10/25/14 1521  WBC 12.7*  NEUTROABS 11.0*  HGB 10.2*  HCT 30.7*  MCV 91.9  PLT 322   Cardiac Enzymes:  Recent Labs Lab 10/25/14 1521  TROPONINI 0.04*    BNP (last 3 results)  Recent Labs  05/24/14 2241  BNP 116.0*    ProBNP (last 3 results) No results for input(s): PROBNP in the last 8760 hours.  CBG: No results for input(s): GLUCAP in the last 168 hours.  Radiological Exams on Admission: Dg Chest 1 View  10/25/2014  CLINICAL DATA:  Chest pain with extension to left arm beginning last night. EXAM: CHEST 1 VIEW COMPARISON:  07/26/2014 FINDINGS: Mild cardiomegaly stable. Pulmonary hyperinflation again seen, consistent with COPD. No evidence of pulmonary infiltrate or edema. No evidence of pleural effusion or pneumothorax. IMPRESSION: Stable cardiomegaly and COPD.  No acute findings. Electronically Signed   By: Earle Gell M.D.   On: 10/25/2014 16:15    EKG: Independently reviewed.  Assessment/Plan Active Problems:   Atypical chest pain   Abnormal EKG   Will observe on telemetry and follow enzymes. Order echo and Cardiology consult. Pain meds as needed. Repeat labs in AM  Diet: soft Fluids: NS@75  DVT Prophylaxis: SQ Heparin  Code Status: FULL  Family Communication: yes  Disposition Plan: home  Time spent: 45 min

## 2014-10-25 NOTE — ED Provider Notes (Signed)
[facility] Emergency Department Provider Note  ____________________________________________  Time seen: Approximately 790 PM  I have reviewed the triage vital signs and the nursing notes.   HISTORY  Chief Complaint Chest Pain    HPI LASHAI GROSCH is a 81 y.o. female with a history of CAD who is presenting today with chest pain over the past 15 hours. She said the pain started last night and is a constant 10 out of 10 at this time. She describes it as an aching and it hurts to move her left arm. She denies any shortness of breath, nausea vomiting or diarrhea. She took 2 baby aspirin this morning but no nitroglycerin at home. Was admitted about a month ago for weakness and also had a positive troponin at that time.   Past Medical History  Diagnosis Date  . Coronary artery disease   . Hypertension   . COPD (chronic obstructive pulmonary disease)   . Asthma   . Diabetes mellitus without complication   . Chronic kidney disease   . GERD (gastroesophageal reflux disease)   . Arthritis   . Anemia     Patient Active Problem List   Diagnosis Date Noted  . General weakness 05/25/2014  . Elevated troponin 05/25/2014  . CAD (coronary artery disease) 05/25/2014  . HTN (hypertension) 05/25/2014  . DM (diabetes mellitus) ([place]) 05/25/2014  . COPD (chronic obstructive pulmonary disease) ([place]) 05/25/2014  . CKD (chronic kidney disease) 05/25/2014  . Chronic anemia 05/25/2014    Past Surgical History  Procedure Laterality Date  . Eye surgery    . Cholecystectomy    . Abdominal hysterectomy      Current Outpatient Rx  Name  Route  Sig  Dispense  Refill  . colestipol (COLESTID) 1 G tablet   Oral   Take 1 g by mouth 2 (two) times daily.         . diphenoxylate-atropine (LOMOTIL) 2.5-0.025 MG per tablet   Oral   Take 2 tablets by mouth 4 (four) times daily as needed for diarrhea or loose stools.          . Fluticasone-Salmeterol (ADVAIR) 250-50  MCG/DOSE AEPB   Inhalation   Inhale 1 puff into the lungs 2 (two) times daily.         Marland Kitchen lovastatin (MEVACOR) 20 MG tablet   Oral   Take 20 mg by mouth daily.          . metoprolol succinate (TOPROL-XL) 25 MG 24 hr tablet   Oral   Take 25 mg by mouth daily.         . pantoprazole (PROTONIX) 40 MG tablet   Oral   Take 40 mg by mouth daily.         . sitaGLIPtin (JANUVIA) 50 MG tablet   Oral   Take 50 mg by mouth daily.         Marland Kitchen torsemide (DEMADEX) 10 MG tablet   Oral   Take 10 mg by mouth 2 (two) times daily.          . traZODone (DESYREL) 50 MG tablet   Oral   Take 50 mg by mouth at bedtime.           Allergies Boniva  Family History  Problem Relation Age of Onset  . CAD Mother   . Cancer Sister   . Hypertension Sister     Social History Social History  Substance Use Topics  . Smoking status: Never Smoker   .  Smokeless tobacco: Not on file  . Alcohol Use: No    Review of Systems Constitutional: No fever/chills Eyes: No visual changes. ENT: No sore throat. Cardiovascular: Complains of chest pain.Marland Kitchen Respiratory: Denies shortness of breath. Gastrointestinal: No abdominal pain.  No nausea, no vomiting.  No diarrhea.  No constipation. Genitourinary: Negative for dysuria. Musculoskeletal: Negative for back pain. Skin: Negative for rash. Neurological: Negative for headaches, focal weakness or numbness.  10-point ROS otherwise negative.  ____________________________________________   PHYSICAL EXAM:  VITAL SIGNS: ED Triage Vitals  Enc Vitals Group     BP 10/25/14 1456 160/90 mmHg     Pulse Rate 10/25/14 1456 79     Resp 10/25/14 1456 18     Temp 10/25/14 1456 98.5 F (36.9 C)     Temp Source 10/25/14 1456 Oral     SpO2 10/25/14 1456 100 %     Weight 10/25/14 1456 169 lb (76.658 kg)     Height 10/25/14 1456 5\' 2"  (1.575 m)     Head Cir --      Peak Flow --      Pain Score 10/25/14 1458 10     Pain Loc --      Pain Edu? --       Excl. in [place]? --     Constitutional: Alert and oriented. Well appearing and in no acute distress. Eyes: Conjunctivae are normal. PERRL. EOMI. Head: Atraumatic. Nose: No congestion/rhinnorhea. Mouth/Throat: Mucous membranes are moist.  Oropharynx non-erythematous. Neck: No stridor.   Cardiovascular: Normal rate, regular rhythm. Grossly normal heart sounds.  Good peripheral circulation. Chest no reproducible palpation. Bilateral dorsalis pedis as well as bilateral radial pulses intact and equal. Respiratory: Normal respiratory effort.  No retractions. Lungs CTAB. Gastrointestinal: Soft and nontender. No distention. No abdominal bruits. No CVA tenderness. Musculoskeletal: Bilateral mild lower extremity edema which is improved for the patient and her daughter. No joint effusions. Neurologic:  Normal speech and language. No gross focal neurologic deficits are appreciated. No gait instability. Skin:  Skin is warm, dry and intact. No rash noted. Psychiatric: Mood and affect are normal. Speech and behavior are normal.  ____________________________________________   LABS (all labs ordered are listed, but only abnormal results are displayed)  Labs Reviewed  CBC WITH DIFFERENTIAL/PLATELET - Abnormal; Notable for the following:    WBC 12.7 (*)    RBC 3.34 (*)    Hemoglobin 10.2 (*)    HCT 30.7 (*)    Neutro Abs 11.0 (*)    Lymphs Abs 0.7 (*)    All other components within normal limits  BASIC METABOLIC PANEL - Abnormal; Notable for the following:    Sodium 133 (*)    Chloride 98 (*)    Glucose, Bld 218 (*)    Creatinine, Ser 1.51 (*)    Calcium 8.0 (*)    GFR calc non Af Amer 30 (*)    GFR calc Af Amer 35 (*)    All other components within normal limits  TROPONIN I - Abnormal; Notable for the following:    Troponin I 0.04 (*)    All other components within normal limits   ____________________________________________  EKG  ED ECG REPORT I, Doran Stabler, the attending  physician, personally viewed and interpreted this ECG.   Date: 10/25/2014  EKG Time: 1501  Rate: 81  Rhythm: normal sinus rhythm  Axis: Normal axis  Intervals:right bundle branch block  ST&T Change: New T-wave inversions in aVF, V3 and V4 from previous EKGs.  There is no ST elevation or depression.  ____________________________________________  RADIOLOGY  Stable cardiomegaly and COPD. No acute findings. I personally reviewed these films. ____________________________________________   PROCEDURES CRITICAL CARE Performed by: Doran Stabler   Total critical care time: 35 minutes  Critical care time was exclusive of separately billable procedures and treating other patients.  Critical care was necessary to treat or prevent imminent or life-threatening deterioration.  Critical care was time spent personally by me on the following activities: development of treatment plan with patient and/or surrogate as well as nursing, discussions with consultants, evaluation of patient's response to treatment, examination of patient, obtaining history from patient or surrogate, ordering and performing treatments and interventions, ordering and review of laboratory studies, ordering and review of radiographic studies, pulse oximetry and re-evaluation of patient's condition.   ____________________________________________   INITIAL IMPRESSION / ASSESSMENT AND PLAN / ED COURSE  Pertinent labs & imaging results that were available during my care of the patient were reviewed by me and considered in my medical decision making (see chart for details).  ----------------------------------------- 4:57 PM on 10/25/2014 -----------------------------------------  Patient resting comfortably at this time. Is not any outward distress. He is receiving her nitroglycerin right now. I did discuss with the family the new EKG changes as well as her elevated troponin. I believe the need for admission is  necessary at this time. Discussed case with Dr. Doy Hutching will admit the patient. We'll hold further anticoagulation pending repeat troponin. ____________________________________________   FINAL CLINICAL IMPRESSION(S) / ED DIAGNOSES  Acute chest pain.    Orbie Pyo, MD 10/25/14 [phone]

## 2014-10-25 NOTE — ED Notes (Signed)
Pt reports chest pain that started last night about 10pm also with left shoulder pain. Increased pain to left arm with movement.

## 2014-10-25 NOTE — Progress Notes (Signed)
SKIN Assessment completed with verifying RN, Lovena Le, day shift nurse. Multiple[le moles of varying sizes and colors noted to entire body. Spider veins to BLE. Corn/callus to left 1st toe. No other skin issues noted.

## 2014-10-25 NOTE — Progress Notes (Addendum)
Patient just arrived to floor from ED. Tele box verified with clerk. Family member at bedside. Night shift RN here to verify skin and get help get patient settled. Patient continues to complain of chest/shoulder pain worse with movement. Night shift RN to review meds and give PRNs as available. Admission process started, will be completed by night shift RN, Cristopher Peru.

## 2014-10-26 ENCOUNTER — Observation Stay
Admit: 2014-10-26 | Discharge: 2014-10-26 | Disposition: A | Discharge status: Home / Self Care | Payer: Commercial Managed Care - HMO | Attending: Internal Medicine | Admitting: Internal Medicine

## 2014-10-26 ENCOUNTER — Observation Stay: Payer: Commercial Managed Care - HMO

## 2014-10-26 ENCOUNTER — Observation Stay: Admit: 2014-10-26 | Payer: Commercial Managed Care - HMO

## 2014-10-26 DIAGNOSIS — R079 Chest pain, unspecified: Secondary | ICD-10-CM | POA: Diagnosis not present

## 2014-10-26 DIAGNOSIS — R0789 Other chest pain: Secondary | ICD-10-CM | POA: Diagnosis not present

## 2014-10-26 DIAGNOSIS — E1122 Type 2 diabetes mellitus with diabetic chronic kidney disease: Secondary | ICD-10-CM | POA: Diagnosis not present

## 2014-10-26 DIAGNOSIS — I2510 Atherosclerotic heart disease of native coronary artery without angina pectoris: Secondary | ICD-10-CM | POA: Diagnosis not present

## 2014-10-26 DIAGNOSIS — M25512 Pain in left shoulder: Secondary | ICD-10-CM | POA: Diagnosis not present

## 2014-10-26 DIAGNOSIS — E871 Hypo-osmolality and hyponatremia: Secondary | ICD-10-CM | POA: Diagnosis not present

## 2014-10-26 DIAGNOSIS — N189 Chronic kidney disease, unspecified: Secondary | ICD-10-CM | POA: Diagnosis not present

## 2014-10-26 DIAGNOSIS — D72829 Elevated white blood cell count, unspecified: Secondary | ICD-10-CM | POA: Diagnosis not present

## 2014-10-26 DIAGNOSIS — R9431 Abnormal electrocardiogram [ECG] [EKG]: Secondary | ICD-10-CM | POA: Diagnosis not present

## 2014-10-26 DIAGNOSIS — R0602 Shortness of breath: Secondary | ICD-10-CM | POA: Diagnosis not present

## 2014-10-26 DIAGNOSIS — I1310 Hypertensive heart and chronic kidney disease without heart failure, with stage 1 through stage 4 chronic kidney disease, or unspecified chronic kidney disease: Secondary | ICD-10-CM | POA: Diagnosis not present

## 2014-10-26 LAB — BASIC METABOLIC PANEL
Anion gap: 6 (ref 5–15)
BUN: 16 mg/dL (ref 6–20)
CALCIUM: 7.8 mg/dL — AB (ref 8.9–10.3)
CO2: 27 mmol/L (ref 22–32)
CREATININE: 1.42 mg/dL — AB (ref 0.44–1.00)
Chloride: 105 mmol/L (ref 101–111)
GFR, EST AFRICAN AMERICAN: 38 mL/min — AB (ref >60)
GFR, EST NON AFRICAN AMERICAN: 32 mL/min — AB (ref >60)
Glucose, Bld: 92 mg/dL (ref 65–99)
Potassium: 3.6 mmol/L (ref 3.5–5.1)
SODIUM: 138 mmol/L (ref 135–145)

## 2014-10-26 LAB — URIC ACID: URIC ACID, SERUM: 4.6 mg/dL (ref 2.3–6.6)

## 2014-10-26 LAB — TROPONIN I
Troponin I: <0.03 ng/mL (ref <0.031)
Troponin I: <0.03 ng/mL (ref <0.031)

## 2014-10-26 LAB — CBC
HCT: 28.8 % — ABNORMAL LOW (ref 35.0–47.0)
Hemoglobin: 9.4 g/dL — ABNORMAL LOW (ref 12.0–16.0)
MCH: 30.1 pg (ref 26.0–34.0)
MCHC: 32.8 g/dL (ref 32.0–36.0)
MCV: 91.8 fL (ref 80.0–100.0)
PLATELETS: 293 10*3/uL (ref 150–440)
RBC: 3.13 MIL/uL — ABNORMAL LOW (ref 3.80–5.20)
RDW: 13.7 % (ref 11.5–14.5)
WBC: 10.1 10*3/uL (ref 3.6–11.0)

## 2014-10-26 LAB — FIBRIN DERIVATIVES D-DIMER (ARMC ONLY): FIBRIN DERIVATIVES D-DIMER (ARMC): 1284 — AB (ref 0–499)

## 2014-10-26 MED ORDER — INDIUM-111 PENTETATE
43.1700 | Freq: Once | INTRAVENOUS | Status: DC | PRN
Start: 2014-10-26 — End: 2014-10-27

## 2014-10-26 MED ORDER — TECHNETIUM TO 99M ALBUMIN AGGREGATED
4.1400 | Freq: Once | INTRAVENOUS | Status: AC | PRN
  Administered 2014-10-26: 4.14 via INTRAVENOUS

## 2014-10-26 MED ORDER — TECHNETIUM TC 99M DIETHYLENETRIAME-PENTAACETIC ACID
43.1700 | Freq: Once | INTRAVENOUS | Status: DC | PRN
  Administered 2014-10-26: 43.17 via INTRAVENOUS
  Filled 2014-10-26: qty 43.17

## 2014-10-26 NOTE — Progress Notes (Signed)
[place] [place] NAME: Lisa Beard    MR#:  [id number]  DATE OF BIRTH:  [birth year]  SUBJECTIVE:  CHIEF COMPLAINT:   Chief Complaint  Patient presents with  . Chest Pain   patient is a 81 year old Caucasian female with history of pulmonary hypertension as well as coronary artery disease status post MI in the remote past who presents to the hospital with left shoulder, left arm and left anterior chest pains, started yesterday suddenly, worsening with arm movements. EKG showed T depressions in anterior leads. First troponin was minimally elevated, however, subsequent to 3 troponins were normal. Patient admitted to the hospital for further evaluation. Patient feels much better today. Denies significant pain in the left chest. However, admits of some shoulder and left arm pain, especially whenever she moves it, actively or passively. Some discomfort on palpation of the left chest as well. Patient was seen by cardiologist recommended stress test tomorrow as inpatient. D-dimer is elevated and patient is to undergo VQ scanning. Echocardiogram is pending  Review of Systems  Constitutional: Negative for fever, chills and weight loss.  HENT: Negative for congestion.   Eyes: Negative for blurred vision and double vision.  Respiratory: Negative for cough, sputum production, shortness of breath and wheezing.   Cardiovascular: Negative for chest pain, palpitations, orthopnea, leg swelling and PND.  Gastrointestinal: Negative for nausea, vomiting, abdominal pain, diarrhea, constipation and blood in stool.  Genitourinary: Negative for dysuria, urgency, frequency and hematuria.  Musculoskeletal: Positive for joint pain. Negative for falls.  Neurological: Negative for dizziness, tremors, focal weakness and headaches.  Endo/Heme/Allergies: Does not bruise/bleed easily.  Psychiatric/Behavioral: Negative for depression. The patient does not have insomnia.      VITAL SIGNS: Blood pressure 151/59, pulse 71, temperature 99.2 F (37.3 C), temperature source Oral, resp. rate 20, height 5\' 2"  (1.575 m), weight 70.398 kg (155 lb 3.2 oz), SpO2 99 %.  PHYSICAL EXAMINATION:   GENERAL:  81 y.o.-year-old patient lying in the bed with no acute distress.  EYES: Pupils equal, round, reactive to light and accommodation. No scleral icterus. Extraocular muscles intact.  HEENT: Head atraumatic, normocephalic. Oropharynx and nasopharynx clear.  NECK:  Supple, no jugular venous distention. No thyroid enlargement, no tenderness.  LUNGS: Normal breath sounds bilaterally, no wheezing, rales,rhonchi or crepitation. No use of accessory muscles of respiration.  CARDIOVASCULAR: S1, S2 normal. No murmurs, rubs, or gallops. Uncomfortable left chest palpation as well as left shoulder palpation and somewhat left arm. Palpation. Minimally restricted left shoulder active range of motion ABDOMEN: Soft, nontender, nondistended. Bowel sounds present. No organomegaly or mass.  EXTREMITIES: No pedal edema, cyanosis, or clubbing.  NEUROLOGIC: Cranial nerves II through XII are intact. Muscle strength 5/5 in all extremities. Sensation intact. Gait not checked.  PSYCHIATRIC: The patient is alert and oriented x 3.  SKIN: No obvious rash, lesion, or ulcer.   ORDERS/RESULTS REVIEWED:   CBC  Recent Labs Lab 10/25/14 1521 10/26/14 0708  WBC 12.7* 10.1  HGB 10.2* 9.4*  HCT 30.7* 28.8*  PLT 322 293  MCV 91.9 91.8  MCH 30.5 30.1  MCHC 33.2 32.8  RDW 14.0 13.7  LYMPHSABS 0.7*  --   MONOABS 0.8  --   EOSABS 0.1  --   BASOSABS 0.0  --    ------------------------------------------------------------------------------------------------------------------  Chemistries   Recent Labs Lab 10/25/14 1521 10/26/14 0708  NA 133* 138  K 3.9 3.6  CL 98* 105  CO2 27 27  GLUCOSE 218*  92  BUN 17 16  CREATININE 1.51* 1.42*  CALCIUM 8.0* 7.8*    ------------------------------------------------------------------------------------------------------------------ estimated creatinine clearance is 26.1 mL/min (by C-G formula based on Cr of 1.42). ------------------------------------------------------------------------------------------------------------------ No results for input(s): TSH, T4TOTAL, T3FREE, THYROIDAB in the last 72 hours.  Invalid input(s): FREET3  Cardiac Enzymes  Recent Labs Lab 10/25/14 1917 10/26/14 0049 10/26/14 0708  TROPONINI 0.03 <0.03 <0.03   ------------------------------------------------------------------------------------------------------------------ Invalid input(s): POCBNP ---------------------------------------------------------------------------------------------------------------  RADIOLOGY: Dg Chest 1 View  10/25/2014  CLINICAL DATA:  Chest pain with extension to left arm beginning last night. EXAM: CHEST 1 VIEW COMPARISON:  07/26/2014 FINDINGS: Mild cardiomegaly stable. Pulmonary hyperinflation again seen, consistent with COPD. No evidence of pulmonary infiltrate or edema. No evidence of pleural effusion or pneumothorax. IMPRESSION: Stable cardiomegaly and COPD.  No acute findings. Electronically Signed   By: Earle Gell M.D.   On: 10/25/2014 16:15    EKG:  Orders placed or performed during the hospital encounter of 10/25/14  . EKG 12-Lead  . EKG 12-Lead  . ED EKG within 10 minutes  . ED EKG within 10 minutes    ASSESSMENT AND PLAN:  Active Problems:   Atypical chest pain   Abnormal EKG   Chest pain 1. Atypical chest pain of unclear etiology, rule out coronary artery disease related, continue patient on current management. Patient's cardiac enzymes were negative. Patient will undergo stress test tomorrow morning and if it is negative, she will likely be discharged home. Patient will have VQ scan today, however, to rule out pulmonary embolism as patient is a pulmonary hypertension  patient and has increased propensity to pulmonary embolism and  since her d-dimer is markedly elevated. Uric acid level is normal, unlikely gout exacerbation in left shoulder.  2. Chronic renal insufficiency seemed to be stable 3. Hyponatremia, likely dehydration. Sodium level has improved with IV fluid administration 4. Leukocytosis, likely stress related. Patient's white blood cell count is normal today 5. Anemia. Get guaiac    Management plans discussed with the patient, family and they are in agreement.   DRUG ALLERGIES:  Allergies  Allergen Reactions  . Boniva [Ibandronic Acid] Other (See Comments)    Reaction:  GI upset    CODE STATUS:     Code Status Orders        Start     Ordered   10/25/14 1906  Full code   Continuous     10/25/14 1905      TOTAL TIME TAKING CARE OF THIS PATIENT: 45  minutes.  Coordination of care time 10-15 minutes  Daylani Deblois M.D on 10/26/2014 at 1:43 PM  Between 7am to 6pm - Pager - [phone]  After 6pm go to [email or web address] - password [email or web address] [facility]  [place] [facility]  Office  [phone]  CC: Primary care physician; [facility], Chrissie Noa, MD

## 2014-10-26 NOTE — Consult Note (Signed)
[place]  CARDIOLOGY CONSULT NOTE  Patient ID: Lisa Beard MRN: [id number] DOB/AGE: 03/12/1926 81 y.o.  Admit date: 10/25/2014 Referring Physician [facility] Primary Physician   Primary Cardiologist Fath Reason for Consultation chest pain  OIZ:[id number] of Present Illness: Lisa Beard is a 81 y.o.female patient with history of cad s/p mi, hypertension, reactive airway disease who was admitted with severe left shoulder, left arm and chest pain. Worse with moving her left arm. Complains of lower extremity discomfort and edema.  Is of note she had an echocardiogram done in October of last year which revealed normal LV function with ejection fraction of 55%. There was mild MR and mild TR with estimated right ventricular systolic pressure of 98.3 mm Hg.She has ruled out for an mi with no significant troponin elevation. She is currently pain free. EKG showed sinus rhtyhm with rbbb and lafb with non specific st t wave changes.    PMH  . CAD (coronary artery disease)  with MI  . Myocardial infarction  . Asthma without status asthmaticus  . Hypertension  . Anemia  chronic, IDA  . Hiatal hernia  . Depression  . Elevated cholesterol  . COPD (chronic obstructive pulmonary disease)  . Menopausal symptoms  . Osteoarthritis  carpal tunnel  . H/O urinary incontinence  intermittent  . Osteoporosis, post-menopausal  s/p Boniva discontinued because of stricture  . Diabetes mellitus type 2, uncomplicated  . Raynaud's disease  elevated sed rate, positive rheumatoid factor  . 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 B12 deficiency  . GERD (gastroesophageal reflux disease)  . Low back pain  . Hyperplastic colon polyp 04/02/2014  . Tubular adenoma of colon 04/02/2014   Past Surgical History She has past surgical history that includes Cholecystectomy; Hysterectomy; Cataract extraction (Bilateral);  Colonoscopy (10/15/00; 05/17/04; 01/29/08; 02/16/12; 04/18/12); egd (10/15/00; 05/17/04; 02/15/12); and Colonoscopy (04/02/2014).   Medications and Allergies  Current Medications  Current Outpatient Prescriptions  Medication Sig Dispense Refill  . ACIDOPHILUS Cap 2 (two) times daily.  . cholecalciferol (VITAMIN D3) 1,000 unit capsule Take 1,000 Units by mouth once daily.  . diphenoxylate-atropine (LOMOTIL) 2.5-0.025 mg tablet Take two tablets by mouth in the morning. Can take up to 8 tablets daily. 90 tablet 0  . ferrous sulfate 325 mg (65 mg iron) capsule Take 325 mg by mouth once daily.  . fluticasone-salmeterol (ADVAIR DISKUS) 250-50 mcg/dose diskus inhaler Inhale 1 inhalation into the lungs as directed. Qd to bid. 1 Inhaler 6  . JANUVIA 50 mg tablet TAKE ONE TABLET BY MOUTH ONCE DAILY 90 tablet 0  . lovastatin (MEVACOR) 20 MG tablet Take 1 tablet (20 mg total) by mouth daily with dinner. 30 tablet 6  . metoprolol succinate (TOPROL-XL) 25 MG XL tablet TAKE ONE TABLET BY MOUTH ONCE DAILY 90 tablet 0  . multivitamin capsule Take 1 capsule by mouth once daily.  . pantoprazole (PROTONIX) 40 MG DR tablet Take 1 tablet (40 mg total) by mouth once daily. 30 tablet 6  . TORsemide (DEMADEX) 10 MG tablet 2 in the am, 1 at midday 90 tablet 2  . traZODone (DESYREL) 50 MG tablet Take 1 tablet (50 mg total) by mouth nightly. 30 tablet 5  . blood glucose diagnostic test strip Use 1 each once daily. Use as instructed.  . blood glucose meter kit Use 1 each as directed.  Marland Kitchen spironolactone (ALDACTONE) 25 MG tablet Take 1 tablet (25 mg total) by  mouth once daily. 30 tablet 11   Current Facility-Administered Medications  Medication Dose Route Frequency Provider Last Rate Last Dose  . cyanocobalamin (VITAMIN B12) injection 1,000 mcg 1,000 mcg Intramuscular Q30 Days Penni Homans Maylon Peppers., MD 1,000 mcg at 07/30/14 1420   Allergies: Boniva     ROS Review of Systems - History obtained from chart review and the  patient General ROS: positive for  - fatigue and chest pain Respiratory ROS: positive for - shortness of breath Cardiovascular ROS: positive for - chest pain Gastrointestinal ROS: no abdominal pain, change in bowel habits, or black or bloody stools Musculoskeletal ROS: negative Neurological ROS: no TIA or stroke symptoms   Past Medical History  Diagnosis Date  . Coronary artery disease   . Hypertension   . COPD (chronic obstructive pulmonary disease) ([place])   . Asthma   . Diabetes mellitus without complication ([place])   . Chronic kidney disease   . GERD (gastroesophageal reflux disease)   . Arthritis   . Anemia     Family History  Problem Relation Age of Onset  . CAD Mother   . Cancer Sister   . Hypertension Sister     Social History   Social History  . Marital Status: Widowed    Spouse Name: N/A  . Number of Children: N/A  . Years of Education: N/A   Occupational History  . Not on file.   Social History Main Topics  . Smoking status: Never Smoker   . Smokeless tobacco: Not on file  . Alcohol Use: No  . Drug Use: No  . Sexual Activity: No   Other Topics Concern  . Not on file   Social History Narrative    Past Surgical History  Procedure Laterality Date  . Eye surgery    . Cholecystectomy    . Abdominal hysterectomy       Prescriptions prior to admission  Medication Sig Dispense Refill Last Dose  . acetaminophen (TYLENOL) 325 MG tablet Take 325 mg by mouth every 6 (six) hours as needed for mild pain, moderate pain or fever.   Past Month at Unknown time  . Cholecalciferol (VITAMIN D3) 1000 UNITS CAPS Take 1,000 Units by mouth daily.   10/25/2014 at Unknown time  . cyanocobalamin (,VITAMIN B-12,) 1000 MCG/ML injection Inject 1,000 mcg into the muscle every 30 (thirty) days.   09/28/2014 at Unknown time  . diphenoxylate-atropine (LOMOTIL) 2.5-0.025 MG per tablet Take 2 tablets by mouth See admin instructions. Take 2 tablets orally once a day in the morning. *Can  take up to 8 tablets daily*   10/25/2014 at Unknown time  . Fluticasone-Salmeterol (ADVAIR) 250-50 MCG/DOSE AEPB Inhale 1 puff into the lungs 2 (two) times daily.   10/25/2014 at Unknown time  . lovastatin (MEVACOR) 20 MG tablet Take 20 mg by mouth daily with supper.    10/24/2014 at Unknown time  . metoprolol succinate (TOPROL-XL) 25 MG 24 hr tablet Take 25 mg by mouth daily.   10/25/2014 at 0800  . pantoprazole (PROTONIX) 40 MG tablet Take 40 mg by mouth daily.   10/25/2014 at Unknown time  . sitaGLIPtin (JANUVIA) 50 MG tablet Take 50 mg by mouth daily.   10/25/2014 at Unknown time  . spironolactone (ALDACTONE) 25 MG tablet Take 25 mg by mouth daily.   10/25/2014 at Unknown time  . torsemide (DEMADEX) 10 MG tablet Take 10 mg by mouth 2 (two) times daily.    10/25/2014 at Unknown time  . traZODone (DESYREL)  50 MG tablet Take 50 mg by mouth at bedtime.   10/24/2014 at Unknown time    Physical Exam: Blood pressure 118/49, pulse 74, temperature 99.1 F (37.3 C), temperature source Oral, resp. rate 18, height _0  (1.575 m), weight 70.398 kg (155 lb 3.2 oz), SpO2 96 %. General appearance: alert, cooperative and appears stated age Resp: clear to auscultation bilaterally Chest wall: no tenderness, left sided chest wall tenderness Cardio: regular rate and rhythm GI: soft, non-tender; bowel sounds normal; no masses,  no organomegaly Extremities: extremities normal, atraumatic, no cyanosis or edema Neurologic: Grossly normal Labs:   Lab Results  Component Value Date   WBC 10.1 10/26/2014   HGB 9.4* 10/26/2014   HCT 28.8* 10/26/2014   MCV 91.8 10/26/2014   PLT 293 10/26/2014    Recent Labs Lab 10/26/14 0708  NA 138  K 3.6  CL 105  CO2 27  BUN 16  CREATININE 1.42*  CALCIUM 7.8*  GLUCOSE 92   Lab Results  Component Value Date   TROPONINI <0.03 10/26/2014      Radiology: cxr revealed stable cardiomegaly and copd with no acute findings EKG: nsr with rbbb and lafb.  ASSESSMENT  AND PLAN:  81 yo with history of pulmonary hypertension, cad s/p mi in the distant past, who was admitted with progressive left sided arm , shoulder and subsequently chest pain. Pain was somwhat atypical in that it began in her left shoulder and radiated to her left arm and subsequently left chest . She has ruled out for an mi and ekg is not significantly changed from baseline. Pain was worsened when moving her left shoulder and with deep palpation. Echo pending. WIll proceed with funcitonal study in am to evalaute for ischemia. If negative would discharge and follow up as outpatinet.  Signed: Teodoro Spray MD, [facility] 10/26/2014, 12:07 PM

## 2014-10-26 NOTE — Progress Notes (Signed)
Inpatient Diabetes Program Recommendations  AACE/ADA: New Consensus Statement on Inpatient Glycemic Control (2015)  Target Ranges:  Prepandial:   less than 140 mg/dL      Peak postprandial:   less than 180 mg/dL (1-2 hours)      Critically ill patients:  140 - 180 mg/dL  Results for Lisa Beard, Lisa Beard (MRN [id number]) as of 10/26/2014 10:48  Ref. Range 10/25/2014 15:21 10/26/2014 07:08  Glucose Latest Ref Range: 65-99 mg/dL 218 (H) 92   Review of Glycemic Control  Diabetes history: DM2 Outpatient Diabetes medications: Januvia 50 mg daily Current orders for Inpatient glycemic control: Tradjenta 5 mg daily  Inpatient Diabetes Program Recommendations: Correction (SSI): While inpatient, please consider ordering CBGs with Novolog sensitive correction ACHS if needed. HgbA1C: Please consider ordering an A1C to evaluate glycemic control over the past 2-3 months.  Thanks, Barnie Alderman, RN, MSN, CCRN, CDE Diabetes Coordinator Inpatient Diabetes Program [phone] (Team Pager from [place] to [place]) [phone] ([place] office) [phone] [facility] office) [phone] [facility] office)\

## 2014-10-26 NOTE — Progress Notes (Signed)
Pt. Rested through out the night without c/o pain. No acute distress noted. Family member at bedside during the night. She ambulated with stand by assist to bathroom, she does have stress incontinence. VSS. Will continue to moniotr pt.

## 2014-10-26 NOTE — Progress Notes (Signed)
*  PRELIMINARY RESULTS* Echocardiogram 2D Echocardiogram has been performed.  Lisa Beard 10/26/2014, 3:11 PM

## 2014-10-27 DIAGNOSIS — M25512 Pain in left shoulder: Secondary | ICD-10-CM

## 2014-10-27 DIAGNOSIS — I1310 Hypertensive heart and chronic kidney disease without heart failure, with stage 1 through stage 4 chronic kidney disease, or unspecified chronic kidney disease: Secondary | ICD-10-CM | POA: Diagnosis not present

## 2014-10-27 DIAGNOSIS — E871 Hypo-osmolality and hyponatremia: Secondary | ICD-10-CM

## 2014-10-27 DIAGNOSIS — N189 Chronic kidney disease, unspecified: Secondary | ICD-10-CM

## 2014-10-27 DIAGNOSIS — E1122 Type 2 diabetes mellitus with diabetic chronic kidney disease: Secondary | ICD-10-CM | POA: Diagnosis not present

## 2014-10-27 DIAGNOSIS — D72829 Elevated white blood cell count, unspecified: Secondary | ICD-10-CM

## 2014-10-27 DIAGNOSIS — R9431 Abnormal electrocardiogram [ECG] [EKG]: Secondary | ICD-10-CM | POA: Diagnosis not present

## 2014-10-27 DIAGNOSIS — R0789 Other chest pain: Secondary | ICD-10-CM | POA: Diagnosis not present

## 2014-10-27 DIAGNOSIS — I2510 Atherosclerotic heart disease of native coronary artery without angina pectoris: Secondary | ICD-10-CM | POA: Diagnosis not present

## 2014-10-27 DIAGNOSIS — N289 Disorder of kidney and ureter, unspecified: Secondary | ICD-10-CM

## 2014-10-27 MED ORDER — ISOSORBIDE MONONITRATE ER 30 MG PO TB24: 30.0000 mg | ORAL_TABLET | Freq: Every day | ORAL | Status: DC

## 2014-10-27 MED ORDER — ASPIRIN EC 81 MG PO TBEC: 81.0000 mg | DELAYED_RELEASE_TABLET | Freq: Every day | ORAL | Status: AC

## 2014-10-27 MED ORDER — ISOSORBIDE MONONITRATE ER 30 MG PO TB24: 30.0000 mg | ORAL_TABLET | Freq: Every day | ORAL | Status: AC

## 2014-10-27 MED ORDER — ATORVASTATIN CALCIUM 20 MG PO TABS: 20.0000 mg | ORAL_TABLET | Freq: Every day | ORAL | Status: AC

## 2014-10-27 NOTE — Care Management (Signed)
Cardiology indicates that patient is stable from cardiac standpoint and could discharge today with plans to obtain functional study as outpatient.  Unable to have it now due to having lung VQ scan yesterday

## 2014-10-27 NOTE — Progress Notes (Signed)
Inpatient Diabetes Program Recommendations  AACE/ADA: New Consensus Statement on Inpatient Glycemic Control (2015)  Target Ranges:  Prepandial:   less than 140 mg/dL      Peak postprandial:   less than 180 mg/dL (1-2 hours)      Critically ill patients:  140 - 180 mg/dL  Results for HAILYNN, SLOVACEK (MRN [id number]) as of 10/27/2014 10:06  Ref. Range 10/25/2014 15:21 10/26/2014 07:08  Glucose Latest Ref Range: 65-99 mg/dL 218 (H) 92   Review of Glycemic Control Diabetes history: DM2 Outpatient Diabetes medications: Januvia 50 mg daily Current orders for Inpatient glycemic control: Tradjenta 5 mg daily  Inpatient Diabetes Program Recommendations: Correction (SSI): While inpatient, please consider ordering CBGs with Novolog sensitive correction ACHS if needed. HgbA1C: Please consider ordering an A1C to evaluate glycemic control over the past 2-3 months.  Thanks, Barnie Alderman, RN, MSN, CCRN, CDE Diabetes Coordinator Inpatient Diabetes Program [phone] (Team Pager from [place] to [place]) [phone] ([place] office) [phone] [facility] office) [phone] [facility] office)\

## 2014-10-27 NOTE — Discharge Summary (Signed)
[facility] [place] NAME: Lisa Beard    MR#:  [id number]  DATE OF BIRTH:  02-22-1926  DATE OF ADMISSION:  10/25/2014 ADMITTING PHYSICIAN: Idelle Crouch, MD  DATE OF DISCHARGE: No discharge date for patient encounter.  PRIMARY CARE PHYSICIAN: FELDPAUSCH, DALE E, MD     ADMISSION DIAGNOSIS:  Chest pain, unspecified chest pain type [R07.9]  DISCHARGE DIAGNOSIS:  Principal Problem:   Atypical chest pain Active Problems:   Abnormal EKG   Chest pain   Left shoulder pain   Chronic renal insufficiency   Hyponatremia   Leukocytosis   SECONDARY DIAGNOSIS:   Past Medical History  Diagnosis Date  . Coronary artery disease   . Hypertension   . COPD (chronic obstructive pulmonary disease) ([place])   . Asthma   . Diabetes mellitus without complication ([place])   . Chronic kidney disease   . GERD (gastroesophageal reflux disease)   . Arthritis   . Anemia     .pro HOSPITAL COURSE:  patient is a 81 year old Caucasian female with history of pulmonary hypertension as well as coronary artery disease status post MI in the remote past who presents to the hospital with left shoulder, left arm and left anterior chest pains, started yesterday suddenly, worsening with arm movements. EKG showed T depressions in anterior leads. First troponin was minimally elevated, however, subsequent to 3 troponins were normal. Patient admitted to the hospital for further evaluation. Patient was seen by cardiologist who recommended stress test, unfortunately patient's d-dimer was found to be elevated and she underwent VQ scanning, which was negative for pulmonary embolism, however, necessitating to cancel stress test. Her shoulder pain has resolved and she was pain-free.  Ccardiologist felt that outpatient stress test would be warranted for her upon discharge. Patient was also complaining of dysphagia symptoms, feeling like she was having recurrent chest pains with  ingestion of cold beverages. It was felt that patient's chest pain could have been related to gastrointestinal problems more than cardiac and gastroenterology evaluation was recommended after cardiac workup is completed Discussion by problem 1. Atypical chest pain of unclear etiology, remains unclear etiology, cardiac enzymes were essentially negative after 1 minimally elevated troponin. Stress test is going to be scheduled by Dr. Satira Mccallum as outpatient, unfortunately, could not have been done here in the hospital because of VQ scan which was negative for PE. We also recommend gastroenterologist evaluation after cardiac workup is completed due to history of burping as well as dysphagia/odynophagia to cold liquids. Patient was advised to continue aspirin as well as Protonix and Imdur was added for her upon discharge 2. Chronic renal insufficiency seemed to be stable 3. Hyponatremia, likely dehydration. Sodium level has improved with IV fluid administration 4. Leukocytosis, likely stress related. Patient's white blood cell count normalized 5. Anemia. Get guaiac 6. Dysphagia, odynophagia. Patient is to continue Protonix and follow up with gastroenterologist as outpatient DISCHARGE CONDITIONS:   Stable  CONSULTS OBTAINED:  Treatment Team:  Teodoro Spray, MD  DRUG ALLERGIES:   Allergies  Allergen Reactions  . Boniva [Ibandronic Acid] Other (See Comments)    Reaction:  GI upset    DISCHARGE MEDICATIONS:   Current Discharge Medication List    START taking these medications   Details  aspirin EC 81 MG tablet Take 1 tablet (81 mg total) by mouth daily. Qty: 30 tablet, Refills: 0    atorvastatin (LIPITOR) 20 MG tablet Take 1 tablet (20 mg total) by mouth daily. Qty:  30 tablet, Refills: 5      CONTINUE these medications which have NOT CHANGED   Details  acetaminophen (TYLENOL) 325 MG tablet Take 325 mg by mouth every 6 (six) hours as needed for mild pain, moderate pain or fever.     Cholecalciferol (VITAMIN D3) 1000 UNITS CAPS Take 1,000 Units by mouth daily.    cyanocobalamin (,VITAMIN B-12,) 1000 MCG/ML injection Inject 1,000 mcg into the muscle every 30 (thirty) days.    diphenoxylate-atropine (LOMOTIL) 2.5-0.025 MG per tablet Take 2 tablets by mouth See admin instructions. Take 2 tablets orally once a day in the morning. *Can take up to 8 tablets daily*    Fluticasone-Salmeterol (ADVAIR) 250-50 MCG/DOSE AEPB Inhale 1 puff into the lungs 2 (two) times daily.    metoprolol succinate (TOPROL-XL) 25 MG 24 hr tablet Take 25 mg by mouth daily.    pantoprazole (PROTONIX) 40 MG tablet Take 40 mg by mouth daily.    sitaGLIPtin (JANUVIA) 50 MG tablet Take 50 mg by mouth daily.    spironolactone (ALDACTONE) 25 MG tablet Take 25 mg by mouth daily.    torsemide (DEMADEX) 10 MG tablet Take 10 mg by mouth 2 (two) times daily.     traZODone (DESYREL) 50 MG tablet Take 50 mg by mouth at bedtime.      STOP taking these medications     lovastatin (MEVACOR) 20 MG tablet      colestipol (COLESTID) 1 G tablet          DISCHARGE INSTRUCTIONS:    Patient is to follow-up with primary care physician. Also, cardiology and gastroenterology as outpatient  If you experience worsening of your admission symptoms, develop shortness of breath, life threatening emergency, suicidal or homicidal thoughts you must seek medical attention immediately by calling 911 or calling your MD immediately  if symptoms less severe.  You Must read complete instructions/literature along with all the possible adverse reactions/side effects for all the Medicines you take and that have been prescribed to you. Take any new Medicines after you have completely understood and accept all the possible adverse reactions/side effects.   Please note  You were cared for by a hospitalist during your hospital stay. If you have any questions about your discharge medications or the care you received while you were  in the hospital after you are discharged, you can call the unit and asked to speak with the hospitalist on call if the hospitalist that took care of you is not available. Once you are discharged, your primary care physician will handle any further medical issues. Please note that NO REFILLS for any discharge medications will be authorized once you are discharged, as it is imperative that you return to your primary care physician (or establish a relationship with a primary care physician if you do not have one) for your aftercare needs so that they can reassess your need for medications and monitor your lab values.    Today   CHIEF COMPLAINT:   Chief Complaint  Patient presents with  . Chest Pain    HISTORY OF PRESENT ILLNESS:  Lisa Beard  is a 81 y.o. female with a known history of pulmonary hypertension, coronary artery disease, hypertension, hyperlipidemia, reactive airway disease who presents to the hospital with left shoulder, left arm and left anterior chest pains, started yesterday suddenly, worsening with arm movements. EKG showed T depressions in anterior leads. First troponin was minimally elevated, however, subsequent to 3 troponins were normal. Patient admitted to the hospital for  further evaluation. Patient was seen by cardiologist who recommended stress test, unfortunately patient's d-dimer was found to be elevated and she underwent VQ scanning, which was negative for pulmonary embolism, however, necessitating to cancel stress test. Her shoulder pain has resolved and she was pain-free.  Ccardiologist felt that outpatient stress test would be warranted for her upon discharge. Patient was also complaining of dysphagia symptoms, feeling like she was having recurrent chest pains with ingestion of cold beverages. It was felt that patient's chest pain could have been related to gastrointestinal problems more than cardiac and gastroenterology evaluation was recommended after cardiac workup is  completed Discussion by problem 1. Atypical chest pain of unclear etiology, remains unclear etiology, cardiac enzymes were essentially negative after 1 minimally elevated troponin. Stress test is going to be scheduled by Dr. Satira Mccallum as outpatient, unfortunately, could not have been done here in the hospital because of VQ scan which was negative for PE. We also recommend gastroenterologist evaluation after cardiac workup is completed due to history of burping as well as dysphagia/odynophagia to cold liquids. Patient was advised to continue aspirin as well as Protonix and Imdur was added for her upon discharge 2. Chronic renal insufficiency seemed to be stable 3. Hyponatremia, likely dehydration. Sodium level has improved with IV fluid administration 4. Leukocytosis, likely stress related. Patient's white blood cell count normalized 5. Anemia. Get guaiac 6. Dysphagia, odynophagia. Patient is to continue Protonix and follow up with gastroenterologist as outpatient   VITAL SIGNS:  Blood pressure 125/59, pulse 93, temperature 98.7 F (37.1 C), temperature source Oral, resp. rate 18, height 5\' 2"  (1.575 m), weight 68.221 kg (150 lb 6.4 oz), SpO2 98 %.  I/O:   Intake/Output Summary (Last 24 hours) at 10/27/14 1340 Last data filed at 10/27/14 0748  Gross per 24 hour  Intake    780 ml  Output      0 ml  Net    780 ml    PHYSICAL EXAMINATION:  GENERAL:  81 y.o.-year-old patient lying in the bed with no acute distress.  EYES: Pupils equal, round, reactive to light and accommodation. No scleral icterus. Extraocular muscles intact.  HEENT: Head atraumatic, normocephalic. Oropharynx and nasopharynx clear.  NECK:  Supple, no jugular venous distention. No thyroid enlargement, no tenderness.  LUNGS: Normal breath sounds bilaterally, no wheezing, rales,rhonchi or crepitation. No use of accessory muscles of respiration.  CARDIOVASCULAR: S1, S2 normal. No murmurs, rubs, or gallops.  ABDOMEN: Soft,  non-tender, non-distended. Bowel sounds present. No organomegaly or mass.  EXTREMITIES: No pedal edema, cyanosis, or clubbing.  NEUROLOGIC: Cranial nerves II through XII are intact. Muscle strength 5/5 in all extremities. Sensation intact. Gait not checked.  PSYCHIATRIC: The patient is alert and oriented x 3.  SKIN: No obvious rash, lesion, or ulcer.   DATA REVIEW:   CBC  Recent Labs Lab 10/26/14 0708  WBC 10.1  HGB 9.4*  HCT 28.8*  PLT 293    Chemistries   Recent Labs Lab 10/26/14 0708  NA 138  K 3.6  CL 105  CO2 27  GLUCOSE 92  BUN 16  CREATININE 1.42*  CALCIUM 7.8*    Cardiac Enzymes  Recent Labs Lab 10/26/14 0708  TROPONINI <0.03    Microbiology Results  Results for orders placed or performed in visit on 09/18/13  [facility] [address])     Status: None   Collection Time: 09/18/13  9:43 AM  Result Value Ref Range Status   Micro Text Report   Final  C.DIFFICILE ANTIGEN       C.DIFFICILE GDH ANTIGEN : NEGATIVE   C.DIFFICILE TOXIN A/B     C.DIFFICILE TOXINS A AND B : NEGATIVE   INTERPRETATION            Negative for C. difficile.    ANTIBIOTIC                                                        RADIOLOGY:  Dg Chest 1 View  10/25/2014  CLINICAL DATA:  Chest pain with extension to left arm beginning last night. EXAM: CHEST 1 VIEW COMPARISON:  07/26/2014 FINDINGS: Mild cardiomegaly stable. Pulmonary hyperinflation again seen, consistent with COPD. No evidence of pulmonary infiltrate or edema. No evidence of pleural effusion or pneumothorax. IMPRESSION: Stable cardiomegaly and COPD.  No acute findings. Electronically Signed   By: Earle Gell M.D.   On: 10/25/2014 16:15   Nm Pulmonary Perf And Vent  10/26/2014  CLINICAL DATA:  Lung Short of breath. Concern for pulmonary embolism EXAM: NUCLEAR MEDICINE VENTILATION - PERFUSION LUNG SCAN TECHNIQUE: Ventilation images were obtained in multiple projections using inhaled aerosol Tc-25m DTPA.  Perfusion images were obtained in multiple projections after intravenous injection of Tc-73m MAA. RADIOPHARMACEUTICALS:  Forty-four Technetium-69m DTPA aerosol inhalation and 4.1 Technetium-50m MAA IV COMPARISON:  Chest radiograph 10/25/2014 FINDINGS: Ventilation: No focal ventilation defect. Perfusion: No wedge shaped peripheral perfusion defects to suggest acute pulmonary embolism. IMPRESSION: No evidence acute pulmonary embolism. Electronically Signed   By: Suzy Bouchard M.D.   On: 10/26/2014 15:09    EKG:   Orders placed or performed during the hospital encounter of 10/25/14  . EKG 12-Lead  . EKG 12-Lead  . ED EKG within 10 minutes  . ED EKG within 10 minutes      Management plans discussed with the patient, family and they are in agreement.  CODE STATUS:     Code Status Orders        Start     Ordered   10/25/14 1906  Full code   Continuous     10/25/14 1905      TOTAL TIME TAKING CARE OF THIS PATIENT: 40 minutes.  Discussed this patient's family extensively, coordination of care time 10 minutes  Luka Reisch M.D on 10/27/2014 at 1:40 PM  Between 7am to 6pm - Pager - [phone]  After 6pm go to [email or web address] - password [email or web address] [facility]  [facility]  Office  [phone]  CC: Primary care physician; [facility], Chrissie Noa, MD

## 2014-10-27 NOTE — Progress Notes (Signed)
Patient is doing quite well from a cardiac standpoint.  No further chest pain.  His room up from myocardia infarction.  Not able to get a functional study today due to lung scan yesterday.  Patient is stable and it is okay from a cardiac standpoint to be discharged with outpatient follow-up in our office for consideration for functional study.  Would continue with current medications including metoprolol succinate at 25 mg daily, p.r.n. Nitroglycerin, pravastatin 20 mg daily, spironolactone 25 mg daily, Demodex 10 mg twice daily.  She will follow up on our office in 1 week.

## 2014-10-27 NOTE — Care Management (Signed)
Observation patient- negative lung VQ scan. Echo results are pending.  Stress test for today.  Notified Kim with [facility] of this observation admission

## 2014-10-27 NOTE — Progress Notes (Signed)
Pt is a&o, VSS, NSR on tele with no complaints of pain or discomfort. Order for lexiscan unable to be completed due to lung scan done on yesterday. Must be 48hrs in between, per [place] ok to send pt ome and do out pt on tomorrow. Order received to D/C pt to home. Discharge instructions given to pt and family, prescriptions electronically submitted. Verbal acknowledgment of understanding from pt and family. Pt currently awaiting volunteer services for D/C.

## 2014-10-30 DIAGNOSIS — I2510 Atherosclerotic heart disease of native coronary artery without angina pectoris: Secondary | ICD-10-CM | POA: Diagnosis not present

## 2014-10-30 DIAGNOSIS — R079 Chest pain, unspecified: Secondary | ICD-10-CM | POA: Diagnosis not present

## 2014-10-30 DIAGNOSIS — I10 Essential (primary) hypertension: Secondary | ICD-10-CM | POA: Diagnosis not present

## 2014-10-30 DIAGNOSIS — E7800 Pure hypercholesterolemia, unspecified: Secondary | ICD-10-CM | POA: Diagnosis not present

## 2014-11-03 DIAGNOSIS — E538 Deficiency of other specified B group vitamins: Secondary | ICD-10-CM | POA: Diagnosis not present

## 2014-11-03 DIAGNOSIS — K529 Noninfective gastroenteritis and colitis, unspecified: Secondary | ICD-10-CM | POA: Diagnosis not present

## 2014-11-04 DIAGNOSIS — R079 Chest pain, unspecified: Secondary | ICD-10-CM | POA: Diagnosis not present

## 2014-11-12 DIAGNOSIS — K219 Gastro-esophageal reflux disease without esophagitis: Secondary | ICD-10-CM | POA: Diagnosis not present

## 2014-11-12 DIAGNOSIS — I2510 Atherosclerotic heart disease of native coronary artery without angina pectoris: Secondary | ICD-10-CM | POA: Diagnosis not present

## 2014-11-27 DIAGNOSIS — J449 Chronic obstructive pulmonary disease, unspecified: Secondary | ICD-10-CM | POA: Diagnosis not present

## 2014-11-27 DIAGNOSIS — I10 Essential (primary) hypertension: Secondary | ICD-10-CM | POA: Diagnosis not present

## 2014-11-27 DIAGNOSIS — I2510 Atherosclerotic heart disease of native coronary artery without angina pectoris: Secondary | ICD-10-CM | POA: Diagnosis not present

## 2014-11-27 DIAGNOSIS — E7800 Pure hypercholesterolemia, unspecified: Secondary | ICD-10-CM | POA: Diagnosis not present

## 2014-12-07 DIAGNOSIS — E538 Deficiency of other specified B group vitamins: Secondary | ICD-10-CM | POA: Diagnosis not present

## 2015-01-05 DIAGNOSIS — E538 Deficiency of other specified B group vitamins: Secondary | ICD-10-CM | POA: Diagnosis not present

## 2015-02-05 DIAGNOSIS — E538 Deficiency of other specified B group vitamins: Secondary | ICD-10-CM | POA: Diagnosis not present

## 2015-02-16 DIAGNOSIS — D649 Anemia, unspecified: Secondary | ICD-10-CM | POA: Diagnosis not present

## 2015-02-16 DIAGNOSIS — E7800 Pure hypercholesterolemia, unspecified: Secondary | ICD-10-CM | POA: Diagnosis not present

## 2015-02-16 DIAGNOSIS — J449 Chronic obstructive pulmonary disease, unspecified: Secondary | ICD-10-CM | POA: Diagnosis not present

## 2015-02-16 DIAGNOSIS — I10 Essential (primary) hypertension: Secondary | ICD-10-CM | POA: Diagnosis not present

## 2015-02-16 DIAGNOSIS — N184 Chronic kidney disease, stage 4 (severe): Secondary | ICD-10-CM | POA: Diagnosis not present

## 2015-02-16 DIAGNOSIS — E1140 Type 2 diabetes mellitus with diabetic neuropathy, unspecified: Secondary | ICD-10-CM | POA: Diagnosis not present

## 2015-02-16 DIAGNOSIS — K219 Gastro-esophageal reflux disease without esophagitis: Secondary | ICD-10-CM | POA: Diagnosis not present

## 2015-02-16 DIAGNOSIS — F5101 Primary insomnia: Secondary | ICD-10-CM | POA: Diagnosis not present

## 2015-02-16 DIAGNOSIS — E1122 Type 2 diabetes mellitus with diabetic chronic kidney disease: Secondary | ICD-10-CM | POA: Diagnosis not present

## 2015-02-26 ENCOUNTER — Other Ambulatory Visit: Payer: Self-pay | Admitting: Nurse Practitioner

## 2015-02-26 ENCOUNTER — Ambulatory Visit
Admission: RE | Admit: 2015-02-26 | Discharge: 2015-02-26 | Disposition: A | Discharge status: Home / Self Care | Payer: Commercial Managed Care - HMO | Source: Ambulatory Visit | Attending: Nurse Practitioner | Admitting: Nurse Practitioner

## 2015-02-26 DIAGNOSIS — K529 Noninfective gastroenteritis and colitis, unspecified: Secondary | ICD-10-CM

## 2015-02-26 DIAGNOSIS — R140 Abdominal distension (gaseous): Secondary | ICD-10-CM | POA: Insufficient documentation

## 2015-02-26 DIAGNOSIS — R1030 Lower abdominal pain, unspecified: Secondary | ICD-10-CM | POA: Diagnosis not present

## 2015-02-26 DIAGNOSIS — R1084 Generalized abdominal pain: Secondary | ICD-10-CM | POA: Diagnosis not present

## 2015-02-26 DIAGNOSIS — Z90710 Acquired absence of both cervix and uterus: Secondary | ICD-10-CM | POA: Diagnosis not present

## 2015-02-26 DIAGNOSIS — R197 Diarrhea, unspecified: Secondary | ICD-10-CM | POA: Insufficient documentation

## 2015-02-26 DIAGNOSIS — R531 Weakness: Secondary | ICD-10-CM | POA: Insufficient documentation

## 2015-02-26 DIAGNOSIS — D649 Anemia, unspecified: Secondary | ICD-10-CM

## 2015-02-26 DIAGNOSIS — K869 Disease of pancreas, unspecified: Secondary | ICD-10-CM | POA: Diagnosis not present

## 2015-02-26 DIAGNOSIS — I700 Atherosclerosis of aorta: Secondary | ICD-10-CM | POA: Insufficient documentation

## 2015-02-26 DIAGNOSIS — K862 Cyst of pancreas: Secondary | ICD-10-CM | POA: Diagnosis not present

## 2015-02-26 DIAGNOSIS — R112 Nausea with vomiting, unspecified: Secondary | ICD-10-CM | POA: Diagnosis not present

## 2015-02-26 LAB — POCT I-STAT CREATININE: CREATININE: 1.4 mg/dL — AB (ref 0.44–1.00)

## 2015-02-26 MED ORDER — IOHEXOL 300 MG/ML  SOLN
100.0000 mL | Freq: Once | INTRAMUSCULAR | Status: AC | PRN
  Administered 2015-02-26: 100 mL via INTRAVENOUS

## 2015-03-02 ENCOUNTER — Telehealth: Payer: Self-pay

## 2015-03-02 NOTE — Telephone Encounter (Signed)
  Oncology Nurse Navigator Documentation  Navigator Location: [facility] (03/02/15 1400) Navigator Encounter Type: Telephone (03/02/15 1400) Telephone: Outgoing Call (03/02/15 1400)                 Interventions: Coordination of Care (03/02/15 1400)   Coordination of Care: EUS (03/02/15 1400)        Acuity: Level 2 (03/02/15 1400)   Acuity Level 2: Initial guidance, education and coordination as needed;Educational needs;Assistance expediting appointments;Ongoing guidance and education throughout treatment as needed (03/02/15 1400)     Time Spent with Patient: 30 (03/02/15 1400)   Spoke with Lisa Beard on the phone regarding scheduling EUS for her. Referral received from [place], NP. Will schedule at [facility] due to procedure not being available here until 03/25/15. Daughter [name] left a voicemail to return call regarding.

## 2015-03-02 NOTE — Telephone Encounter (Signed)
  Oncology Nurse Navigator Documentation  Navigator Location: [facility] (03/02/15 1458)                     Interventions: Coordination of Care (03/02/15 1458)   Coordination of Care: EUS (03/02/15 1458)               Spoke with daughter Jenny Reichmann on the phone. EUS will be arranged at [facility]. They will contact her for appt. [facility] notified for any prior authorization needed.

## 2015-03-05 DIAGNOSIS — R888 Abnormal findings in other body fluids and substances: Secondary | ICD-10-CM | POA: Diagnosis not present

## 2015-03-05 DIAGNOSIS — J449 Chronic obstructive pulmonary disease, unspecified: Secondary | ICD-10-CM | POA: Diagnosis not present

## 2015-03-05 DIAGNOSIS — K8689 Other specified diseases of pancreas: Secondary | ICD-10-CM | POA: Diagnosis not present

## 2015-03-05 DIAGNOSIS — I10 Essential (primary) hypertension: Secondary | ICD-10-CM | POA: Diagnosis not present

## 2015-03-05 DIAGNOSIS — J45909 Unspecified asthma, uncomplicated: Secondary | ICD-10-CM | POA: Diagnosis not present

## 2015-03-05 DIAGNOSIS — E119 Type 2 diabetes mellitus without complications: Secondary | ICD-10-CM | POA: Diagnosis not present

## 2015-03-05 DIAGNOSIS — R079 Chest pain, unspecified: Secondary | ICD-10-CM | POA: Diagnosis not present

## 2015-03-05 DIAGNOSIS — Z79899 Other long term (current) drug therapy: Secondary | ICD-10-CM | POA: Diagnosis not present

## 2015-03-05 DIAGNOSIS — R1010 Upper abdominal pain, unspecified: Secondary | ICD-10-CM | POA: Diagnosis not present

## 2015-03-05 DIAGNOSIS — R933 Abnormal findings on diagnostic imaging of other parts of digestive tract: Secondary | ICD-10-CM | POA: Diagnosis not present

## 2015-03-05 DIAGNOSIS — K862 Cyst of pancreas: Secondary | ICD-10-CM | POA: Diagnosis not present

## 2015-03-05 DIAGNOSIS — K869 Disease of pancreas, unspecified: Secondary | ICD-10-CM | POA: Diagnosis not present

## 2015-03-05 DIAGNOSIS — Z7951 Long term (current) use of inhaled steroids: Secondary | ICD-10-CM | POA: Diagnosis not present

## 2015-03-16 DIAGNOSIS — E538 Deficiency of other specified B group vitamins: Secondary | ICD-10-CM | POA: Diagnosis not present

## 2015-03-17 DIAGNOSIS — K862 Cyst of pancreas: Secondary | ICD-10-CM | POA: Diagnosis not present

## 2015-03-18 DIAGNOSIS — D490 Neoplasm of unspecified behavior of digestive system: Secondary | ICD-10-CM | POA: Diagnosis not present

## 2015-03-22 DIAGNOSIS — N183 Chronic kidney disease, stage 3 (moderate): Secondary | ICD-10-CM | POA: Diagnosis not present

## 2015-03-22 DIAGNOSIS — I129 Hypertensive chronic kidney disease with stage 1 through stage 4 chronic kidney disease, or unspecified chronic kidney disease: Secondary | ICD-10-CM | POA: Diagnosis not present

## 2015-03-22 DIAGNOSIS — N2581 Secondary hyperparathyroidism of renal origin: Secondary | ICD-10-CM | POA: Diagnosis not present

## 2015-03-22 DIAGNOSIS — D631 Anemia in chronic kidney disease: Secondary | ICD-10-CM | POA: Diagnosis not present

## 2015-03-22 DIAGNOSIS — E875 Hyperkalemia: Secondary | ICD-10-CM | POA: Diagnosis not present

## 2015-03-22 DIAGNOSIS — E1122 Type 2 diabetes mellitus with diabetic chronic kidney disease: Secondary | ICD-10-CM | POA: Diagnosis not present

## 2015-04-20 DIAGNOSIS — N183 Chronic kidney disease, stage 3 (moderate): Secondary | ICD-10-CM | POA: Diagnosis not present

## 2015-04-20 DIAGNOSIS — I10 Essential (primary) hypertension: Secondary | ICD-10-CM | POA: Diagnosis not present

## 2015-04-20 DIAGNOSIS — E8809 Other disorders of plasma-protein metabolism, not elsewhere classified: Secondary | ICD-10-CM | POA: Diagnosis not present

## 2015-05-04 DIAGNOSIS — E538 Deficiency of other specified B group vitamins: Secondary | ICD-10-CM | POA: Diagnosis not present

## 2015-06-01 DIAGNOSIS — E538 Deficiency of other specified B group vitamins: Secondary | ICD-10-CM | POA: Diagnosis not present

## 2015-06-16 DIAGNOSIS — E7800 Pure hypercholesterolemia, unspecified: Secondary | ICD-10-CM | POA: Diagnosis not present

## 2015-06-16 DIAGNOSIS — E1122 Type 2 diabetes mellitus with diabetic chronic kidney disease: Secondary | ICD-10-CM | POA: Diagnosis not present

## 2015-06-16 DIAGNOSIS — N184 Chronic kidney disease, stage 4 (severe): Secondary | ICD-10-CM | POA: Diagnosis not present

## 2015-06-21 DIAGNOSIS — I129 Hypertensive chronic kidney disease with stage 1 through stage 4 chronic kidney disease, or unspecified chronic kidney disease: Secondary | ICD-10-CM | POA: Diagnosis not present

## 2015-06-21 DIAGNOSIS — N183 Chronic kidney disease, stage 3 (moderate): Secondary | ICD-10-CM | POA: Diagnosis not present

## 2015-06-21 DIAGNOSIS — E1122 Type 2 diabetes mellitus with diabetic chronic kidney disease: Secondary | ICD-10-CM | POA: Diagnosis not present

## 2015-06-21 DIAGNOSIS — D631 Anemia in chronic kidney disease: Secondary | ICD-10-CM | POA: Diagnosis not present

## 2015-06-21 DIAGNOSIS — N2581 Secondary hyperparathyroidism of renal origin: Secondary | ICD-10-CM | POA: Diagnosis not present

## 2015-06-23 DIAGNOSIS — E1140 Type 2 diabetes mellitus with diabetic neuropathy, unspecified: Secondary | ICD-10-CM | POA: Diagnosis not present

## 2015-06-23 DIAGNOSIS — K219 Gastro-esophageal reflux disease without esophagitis: Secondary | ICD-10-CM | POA: Diagnosis not present

## 2015-06-23 DIAGNOSIS — I10 Essential (primary) hypertension: Secondary | ICD-10-CM | POA: Diagnosis not present

## 2015-06-23 DIAGNOSIS — D649 Anemia, unspecified: Secondary | ICD-10-CM | POA: Diagnosis not present

## 2015-06-23 DIAGNOSIS — J449 Chronic obstructive pulmonary disease, unspecified: Secondary | ICD-10-CM | POA: Diagnosis not present

## 2015-06-23 DIAGNOSIS — N184 Chronic kidney disease, stage 4 (severe): Secondary | ICD-10-CM | POA: Diagnosis not present

## 2015-06-23 DIAGNOSIS — E7800 Pure hypercholesterolemia, unspecified: Secondary | ICD-10-CM | POA: Diagnosis not present

## 2015-06-23 DIAGNOSIS — E1122 Type 2 diabetes mellitus with diabetic chronic kidney disease: Secondary | ICD-10-CM | POA: Diagnosis not present

## 2015-06-23 DIAGNOSIS — F5101 Primary insomnia: Secondary | ICD-10-CM | POA: Diagnosis not present

## 2015-07-06 DIAGNOSIS — E538 Deficiency of other specified B group vitamins: Secondary | ICD-10-CM | POA: Diagnosis not present

## 2015-07-21 ENCOUNTER — Other Ambulatory Visit: Payer: Self-pay | Admitting: Family Medicine

## 2015-07-21 ENCOUNTER — Ambulatory Visit
Admission: RE | Admit: 2015-07-21 | Discharge: 2015-07-21 | Disposition: A | Discharge status: Home / Self Care | Payer: Commercial Managed Care - HMO | Source: Ambulatory Visit | Attending: Family Medicine | Admitting: Family Medicine

## 2015-07-21 DIAGNOSIS — H538 Other visual disturbances: Secondary | ICD-10-CM | POA: Diagnosis not present

## 2015-07-21 DIAGNOSIS — M4802 Spinal stenosis, cervical region: Secondary | ICD-10-CM | POA: Diagnosis not present

## 2015-07-21 DIAGNOSIS — R519 Headache, unspecified: Secondary | ICD-10-CM

## 2015-07-21 DIAGNOSIS — D490 Neoplasm of unspecified behavior of digestive system: Secondary | ICD-10-CM

## 2015-07-21 DIAGNOSIS — R51 Headache: Secondary | ICD-10-CM | POA: Insufficient documentation

## 2015-07-21 DIAGNOSIS — R9089 Other abnormal findings on diagnostic imaging of central nervous system: Secondary | ICD-10-CM | POA: Insufficient documentation

## 2015-07-24 DIAGNOSIS — Z961 Presence of intraocular lens: Secondary | ICD-10-CM | POA: Diagnosis not present

## 2015-07-24 DIAGNOSIS — E089 Diabetes mellitus due to underlying condition without complications: Secondary | ICD-10-CM | POA: Diagnosis not present

## 2015-07-24 DIAGNOSIS — H43813 Vitreous degeneration, bilateral: Secondary | ICD-10-CM | POA: Diagnosis not present

## 2015-07-24 DIAGNOSIS — E119 Type 2 diabetes mellitus without complications: Secondary | ICD-10-CM | POA: Diagnosis not present

## 2015-07-24 DIAGNOSIS — H524 Presbyopia: Secondary | ICD-10-CM | POA: Diagnosis not present

## 2015-08-05 DIAGNOSIS — E538 Deficiency of other specified B group vitamins: Secondary | ICD-10-CM | POA: Diagnosis not present

## 2015-09-06 DIAGNOSIS — E538 Deficiency of other specified B group vitamins: Secondary | ICD-10-CM | POA: Diagnosis not present

## 2015-09-16 DIAGNOSIS — D649 Anemia, unspecified: Secondary | ICD-10-CM | POA: Diagnosis not present

## 2015-09-16 DIAGNOSIS — N184 Chronic kidney disease, stage 4 (severe): Secondary | ICD-10-CM | POA: Diagnosis not present

## 2015-09-16 DIAGNOSIS — E7800 Pure hypercholesterolemia, unspecified: Secondary | ICD-10-CM | POA: Diagnosis not present

## 2015-09-16 DIAGNOSIS — E1122 Type 2 diabetes mellitus with diabetic chronic kidney disease: Secondary | ICD-10-CM | POA: Diagnosis not present

## 2015-09-21 IMAGING — CR DG CHEST 1V PORT
1 series · 1 of 1 positions shown · non-contrast
Comparison: None.

CLINICAL DATA: Generalized weakness and diarrhea for 1 month.
Hypertension.

EXAM:
PORTABLE CHEST - 1 VIEW

[ap]
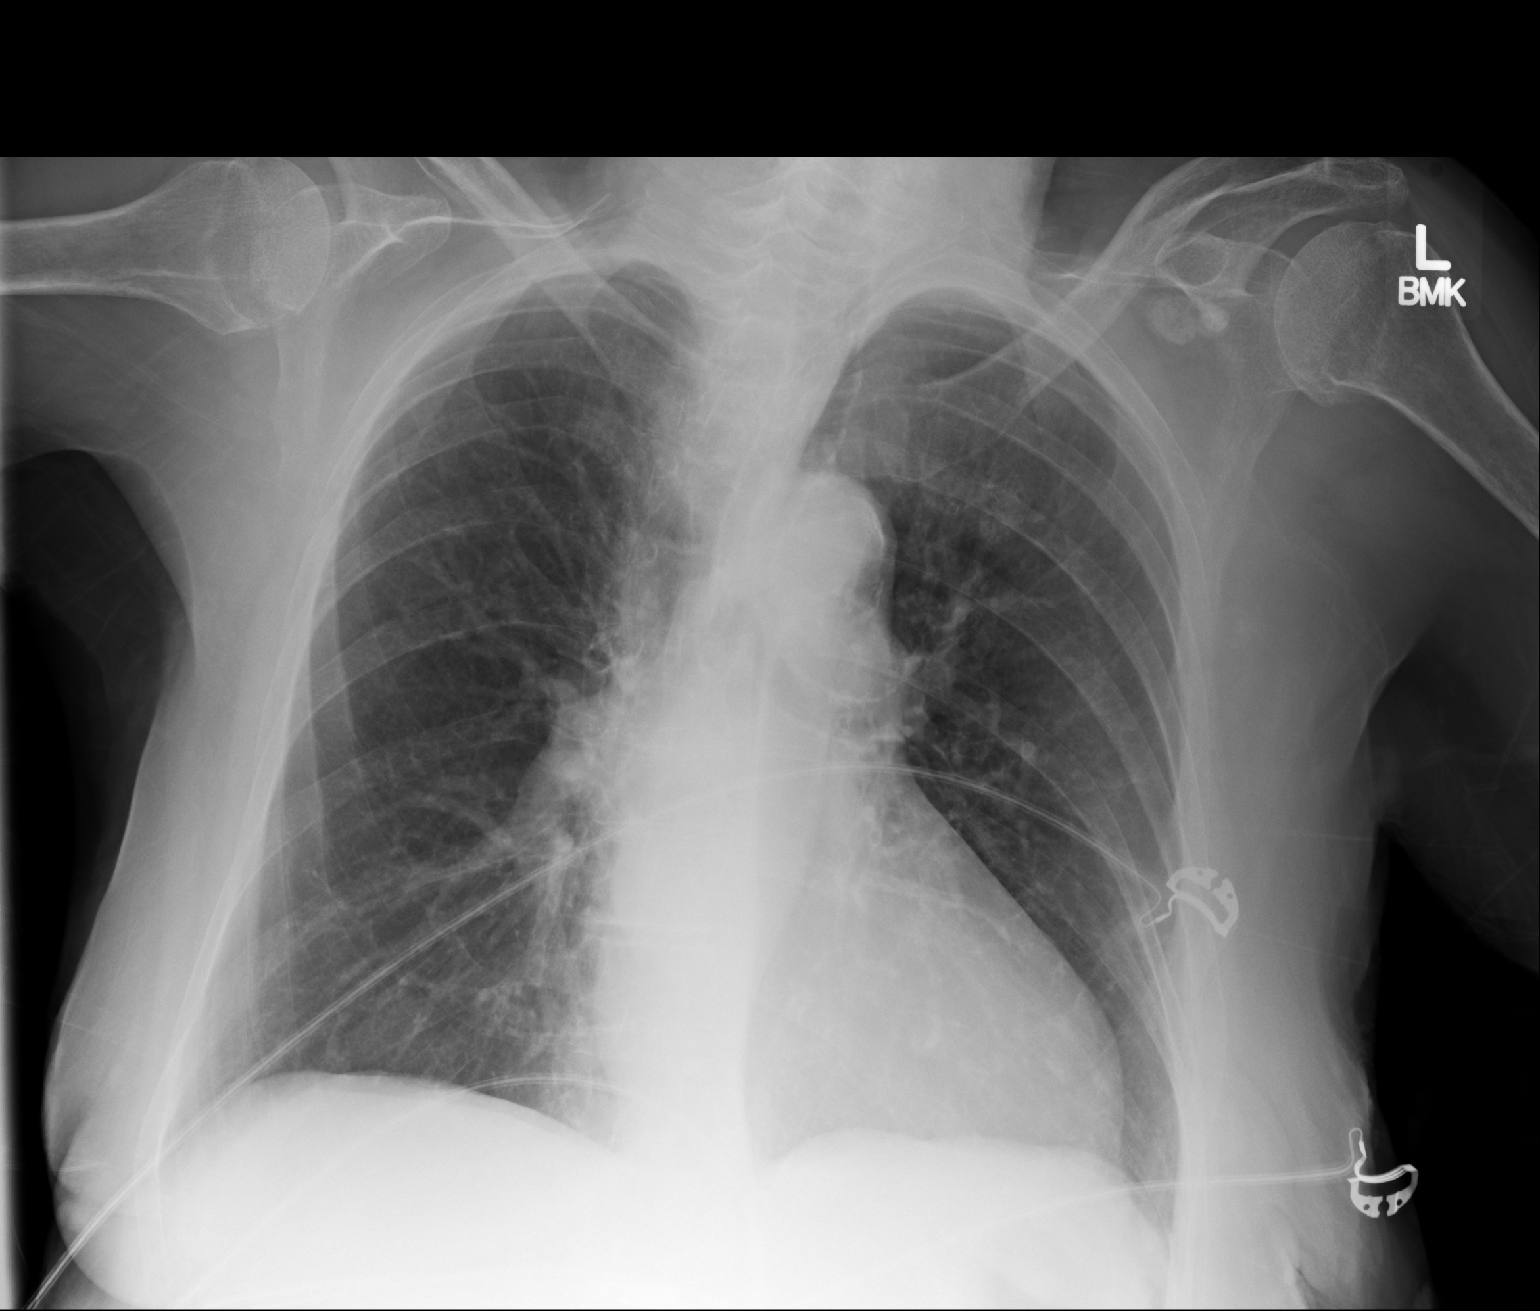

[1 of 1 positions shown; findings below may reference images not displayed]

FINDINGS: A single AP portable view of the chest demonstrates no focal
airspace consolidation or alveolar edema. The lungs are grossly
clear. There is no large effusion or pneumothorax. Cardiac and
mediastinal contours appear unremarkable.
IMPRESSION: No active disease.

## 2015-09-23 DIAGNOSIS — K219 Gastro-esophageal reflux disease without esophagitis: Secondary | ICD-10-CM | POA: Diagnosis not present

## 2015-09-23 DIAGNOSIS — D649 Anemia, unspecified: Secondary | ICD-10-CM | POA: Diagnosis not present

## 2015-09-23 DIAGNOSIS — E1122 Type 2 diabetes mellitus with diabetic chronic kidney disease: Secondary | ICD-10-CM | POA: Diagnosis not present

## 2015-09-23 DIAGNOSIS — I10 Essential (primary) hypertension: Secondary | ICD-10-CM | POA: Diagnosis not present

## 2015-09-23 DIAGNOSIS — E1140 Type 2 diabetes mellitus with diabetic neuropathy, unspecified: Secondary | ICD-10-CM | POA: Diagnosis not present

## 2015-09-23 DIAGNOSIS — N183 Chronic kidney disease, stage 3 (moderate): Secondary | ICD-10-CM | POA: Diagnosis not present

## 2015-09-23 DIAGNOSIS — J449 Chronic obstructive pulmonary disease, unspecified: Secondary | ICD-10-CM | POA: Diagnosis not present

## 2015-09-23 DIAGNOSIS — E7800 Pure hypercholesterolemia, unspecified: Secondary | ICD-10-CM | POA: Diagnosis not present

## 2015-09-23 DIAGNOSIS — F5101 Primary insomnia: Secondary | ICD-10-CM | POA: Diagnosis not present

## 2015-10-08 DIAGNOSIS — E538 Deficiency of other specified B group vitamins: Secondary | ICD-10-CM | POA: Diagnosis not present

## 2015-10-13 IMAGING — US US EXTREM LOW VENOUS BILAT
1 series · 13 of 24 positions shown · non-contrast
Comparison: None.

CLINICAL DATA: Bilateral leg swelling for several months. Pain and
edema.



[Series 1: us extrem low venous bilat · 0.07mm/px · 13 of 57 slices shown]
[im 1/57]
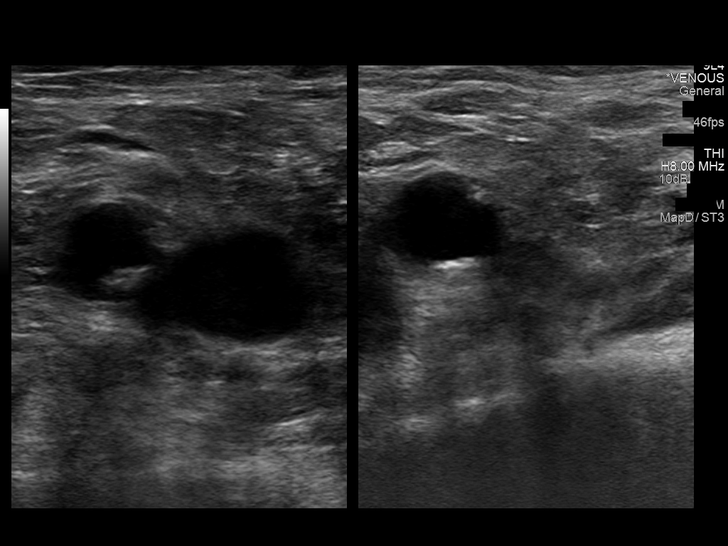
[im 5/57]
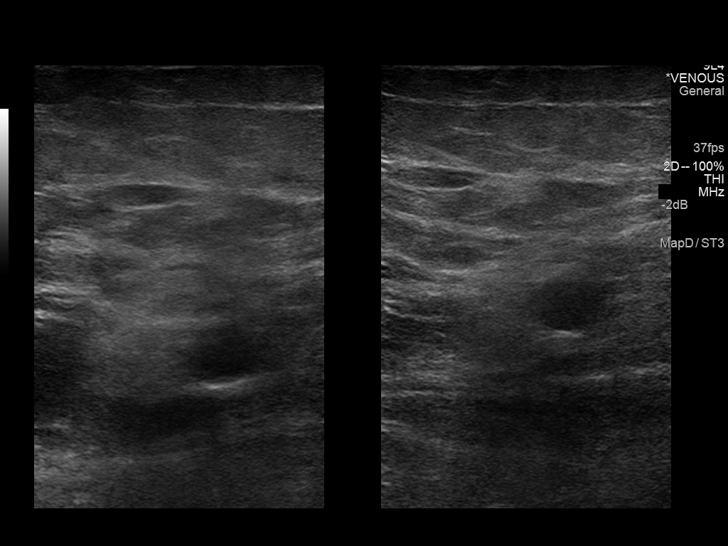
[im 10/57]
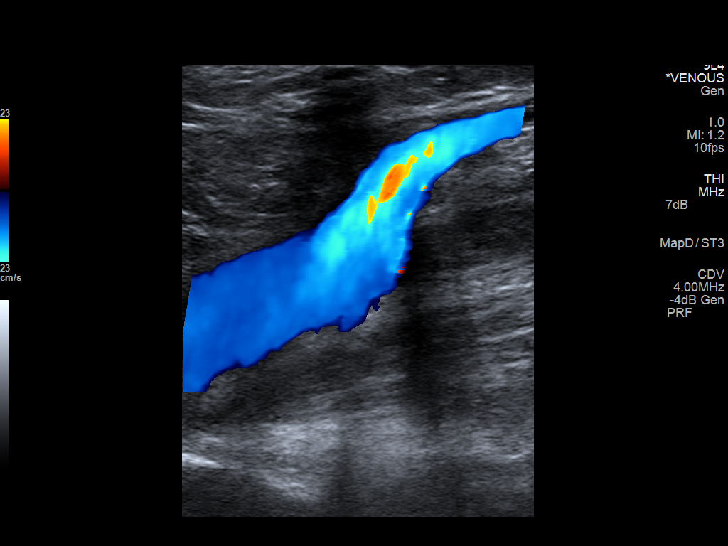
[im 15/57]
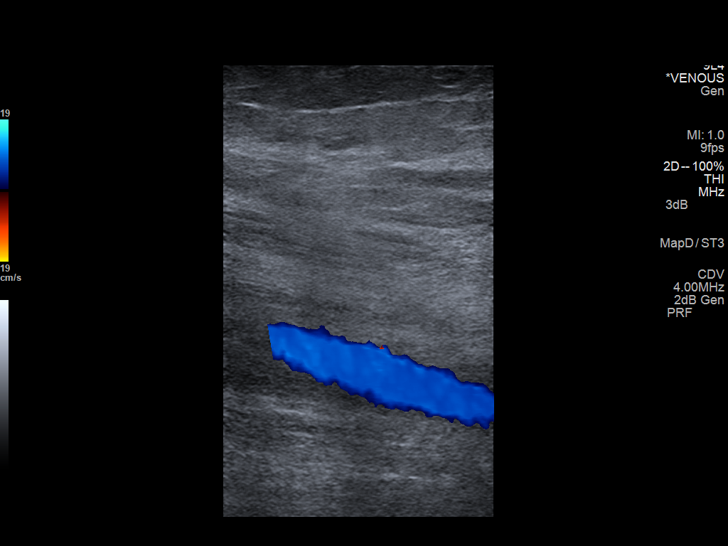
[im 20/57]
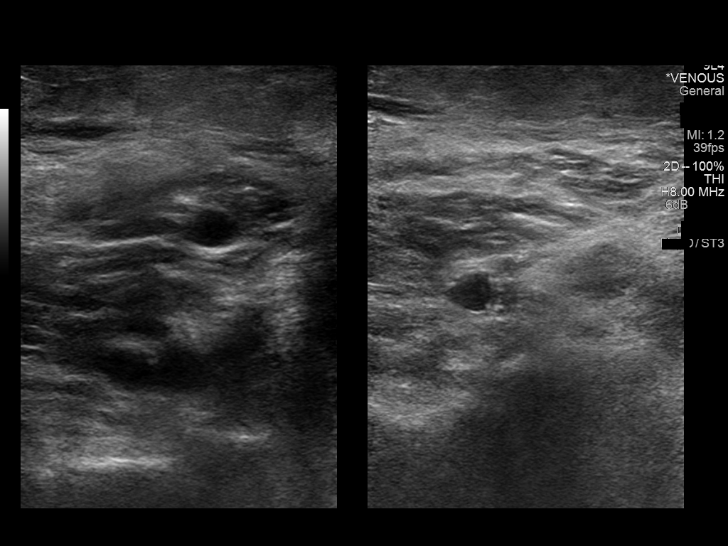
[im 25/57]
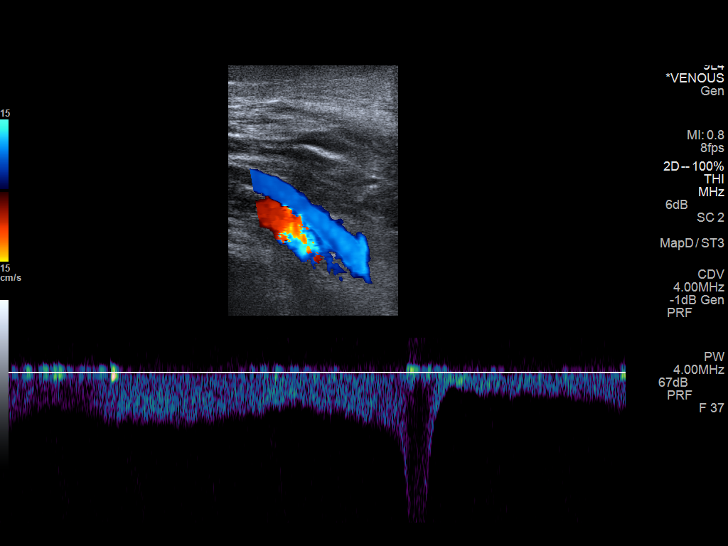
[im 30/57]
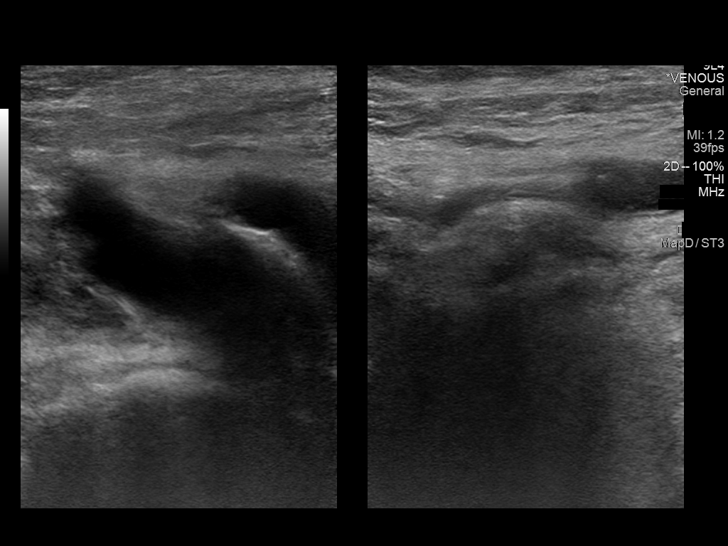
[im 32/57]
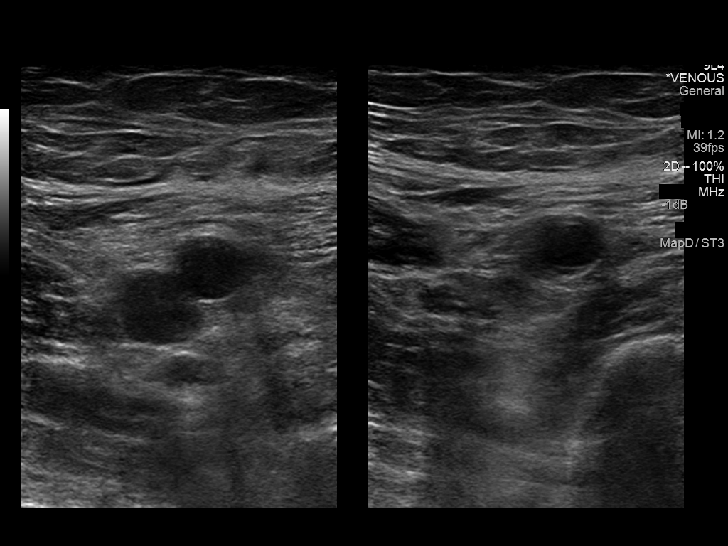
[im 37/57]
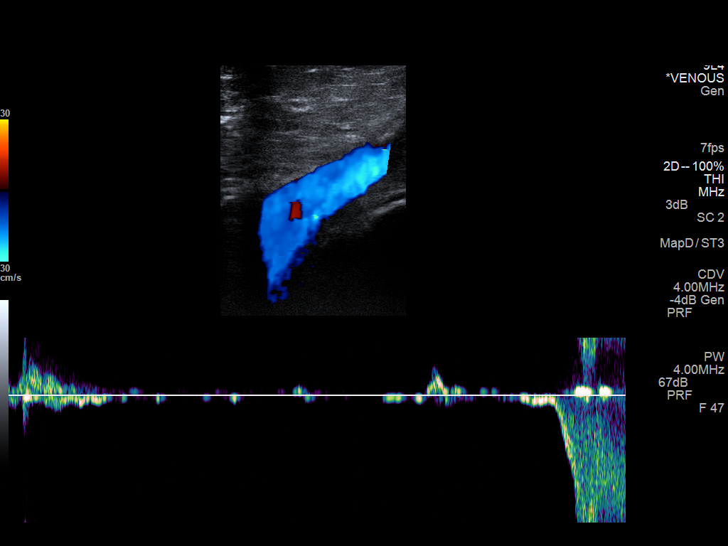
[im 42/57]
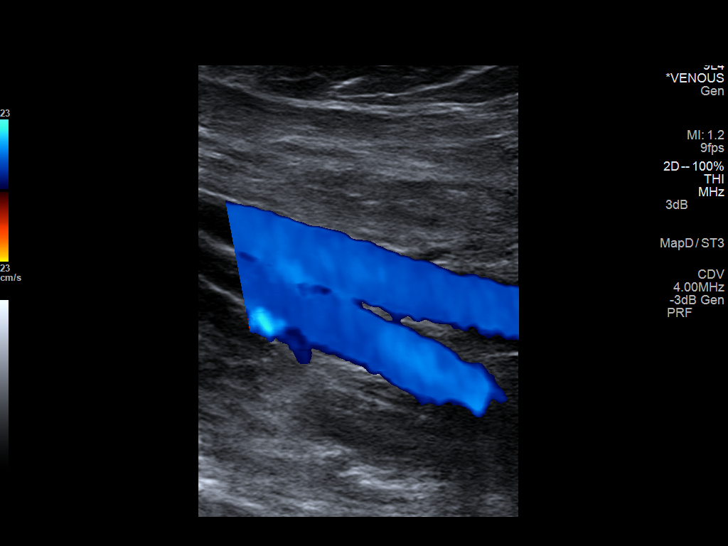
[im 47/57]
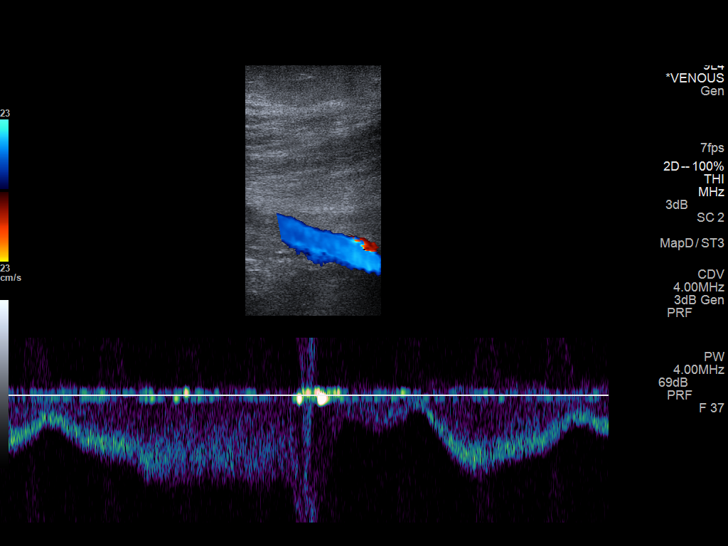
[im 52/57]
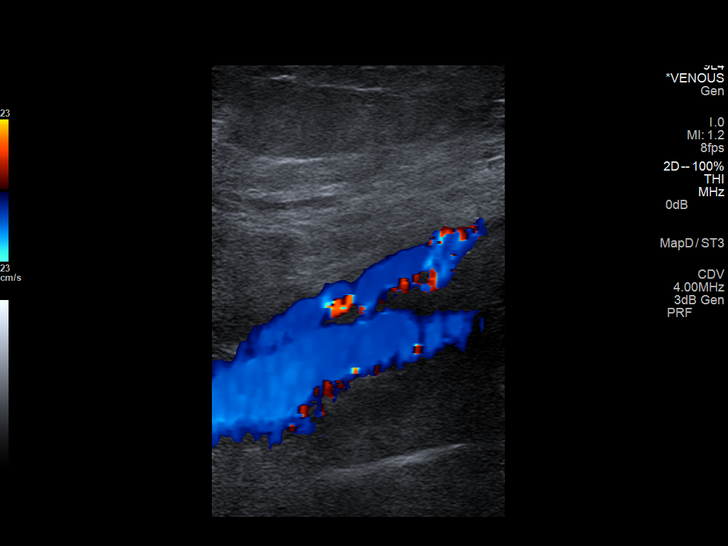
[im 57/57]
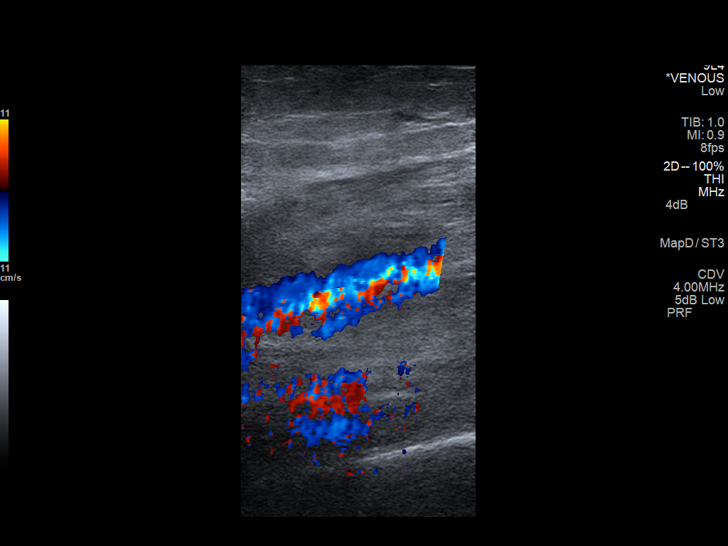

[13 of 24 positions shown; findings below may reference images not displayed]

FINDINGS: RIGHT LOWER EXTREMITY

Common Femoral Vein: No evidence of thrombus. Normal
compressibility, respiratory phasicity and response to augmentation.

Saphenofemoral Junction: No evidence of thrombus. Normal
compressibility and flow on color Doppler imaging.

Profunda Femoral Vein: No evidence of thrombus. Normal
compressibility and flow on color Doppler imaging.

Femoral Vein: No evidence of thrombus. Normal compressibility,
respiratory phasicity and response to augmentation.

Popliteal Vein: No evidence of thrombus. Normal compressibility,
respiratory phasicity and response to augmentation.

Calf Veins: No evidence of thrombus. Normal compressibility and flow
on color Doppler imaging.

Superficial Great Saphenous Vein: No evidence of thrombus. Normal
compressibility and flow on color Doppler imaging.

Venous Reflux:  None.

Other Findings:  None.

LEFT LOWER EXTREMITY

Common Femoral Vein: No evidence of thrombus. Normal
compressibility, respiratory phasicity and response to augmentation.

Saphenofemoral Junction: No evidence of thrombus. Normal
compressibility and flow on color Doppler imaging.

Profunda Femoral Vein: No evidence of thrombus. Normal
compressibility and flow on color Doppler imaging.

Femoral Vein: No evidence of thrombus. Normal compressibility,
respiratory phasicity and response to augmentation.

Popliteal Vein: No evidence of thrombus. Normal compressibility,
respiratory phasicity and response to augmentation.

Calf Veins: No evidence of thrombus. Normal compressibility and flow
on color Doppler imaging.

Superficial Great Saphenous Vein: No evidence of thrombus. Normal
compressibility and flow on color Doppler imaging.

Venous Reflux:  None.

Other Findings:  None.
IMPRESSION: No evidence of deep venous thrombosis.

## 2015-10-20 ENCOUNTER — Encounter: Payer: Self-pay | Admitting: Emergency Medicine

## 2015-10-20 ENCOUNTER — Emergency Department
Admission: EM | Admit: 2015-10-20 | Discharge: 2015-10-21 | Disposition: A | Discharge status: Home / Self Care | Payer: Commercial Managed Care - HMO | Attending: Emergency Medicine | Admitting: Emergency Medicine

## 2015-10-20 ENCOUNTER — Emergency Department: Payer: Commercial Managed Care - HMO

## 2015-10-20 DIAGNOSIS — I129 Hypertensive chronic kidney disease with stage 1 through stage 4 chronic kidney disease, or unspecified chronic kidney disease: Secondary | ICD-10-CM | POA: Insufficient documentation

## 2015-10-20 DIAGNOSIS — Z7982 Long term (current) use of aspirin: Secondary | ICD-10-CM | POA: Insufficient documentation

## 2015-10-20 DIAGNOSIS — L03115 Cellulitis of right lower limb: Secondary | ICD-10-CM | POA: Diagnosis not present

## 2015-10-20 DIAGNOSIS — J45909 Unspecified asthma, uncomplicated: Secondary | ICD-10-CM | POA: Insufficient documentation

## 2015-10-20 DIAGNOSIS — I2510 Atherosclerotic heart disease of native coronary artery without angina pectoris: Secondary | ICD-10-CM | POA: Diagnosis not present

## 2015-10-20 DIAGNOSIS — R0602 Shortness of breath: Secondary | ICD-10-CM | POA: Insufficient documentation

## 2015-10-20 DIAGNOSIS — R109 Unspecified abdominal pain: Secondary | ICD-10-CM | POA: Insufficient documentation

## 2015-10-20 DIAGNOSIS — J449 Chronic obstructive pulmonary disease, unspecified: Secondary | ICD-10-CM | POA: Insufficient documentation

## 2015-10-20 DIAGNOSIS — K8689 Other specified diseases of pancreas: Secondary | ICD-10-CM | POA: Diagnosis not present

## 2015-10-20 DIAGNOSIS — Z79899 Other long term (current) drug therapy: Secondary | ICD-10-CM | POA: Insufficient documentation

## 2015-10-20 DIAGNOSIS — L03116 Cellulitis of left lower limb: Secondary | ICD-10-CM | POA: Diagnosis not present

## 2015-10-20 DIAGNOSIS — N189 Chronic kidney disease, unspecified: Secondary | ICD-10-CM | POA: Insufficient documentation

## 2015-10-20 DIAGNOSIS — E1122 Type 2 diabetes mellitus with diabetic chronic kidney disease: Secondary | ICD-10-CM | POA: Insufficient documentation

## 2015-10-20 DIAGNOSIS — R0789 Other chest pain: Secondary | ICD-10-CM | POA: Insufficient documentation

## 2015-10-20 DIAGNOSIS — L0390 Cellulitis, unspecified: Secondary | ICD-10-CM

## 2015-10-20 DIAGNOSIS — R1310 Dysphagia, unspecified: Secondary | ICD-10-CM | POA: Diagnosis not present

## 2015-10-20 DIAGNOSIS — R1319 Other dysphagia: Secondary | ICD-10-CM | POA: Insufficient documentation

## 2015-10-20 HISTORY — DX: Disorder of kidney and ureter, unspecified: N28.9

## 2015-10-20 NOTE — ED Triage Notes (Addendum)
Pt to triage via w/c with no distress noted; c/o SHOB with difficulty swallowing tonight; denies any c/o pain; denies hx of same

## 2015-10-20 NOTE — ED Notes (Signed)
Pt reports difficulty swallowing over the last few days, with increasing difficulty tonight PTA. Pt denies feeling like she has something stuck in her throat, just "feels blowed up, heavy-like".  Pt reports nausea, but denies vomiting or diarrhea. Pt also reports several nights of "night sweats".

## 2015-10-21 ENCOUNTER — Emergency Department: Payer: Commercial Managed Care - HMO

## 2015-10-21 DIAGNOSIS — R1310 Dysphagia, unspecified: Secondary | ICD-10-CM | POA: Diagnosis not present

## 2015-10-21 DIAGNOSIS — K8689 Other specified diseases of pancreas: Secondary | ICD-10-CM | POA: Diagnosis not present

## 2015-10-21 DIAGNOSIS — R109 Unspecified abdominal pain: Secondary | ICD-10-CM | POA: Diagnosis not present

## 2015-10-21 LAB — COMPREHENSIVE METABOLIC PANEL
ALT: 14 U/L (ref 14–54)
AST: 19 U/L (ref 15–41)
Albumin: 3.3 g/dL — ABNORMAL LOW (ref 3.5–5.0)
Alkaline Phosphatase: 129 U/L — ABNORMAL HIGH (ref 38–126)
Anion gap: 8 (ref 5–15)
BILIRUBIN TOTAL: 0.2 mg/dL — AB (ref 0.3–1.2)
BUN: 13 mg/dL (ref 6–20)
CHLORIDE: 103 mmol/L (ref 101–111)
CO2: 27 mmol/L (ref 22–32)
CREATININE: 1.34 mg/dL — AB (ref 0.44–1.00)
Calcium: 8.2 mg/dL — ABNORMAL LOW (ref 8.9–10.3)
GFR, EST AFRICAN AMERICAN: 40 mL/min — AB (ref >60)
GFR, EST NON AFRICAN AMERICAN: 35 mL/min — AB (ref >60)
Glucose, Bld: 118 mg/dL — ABNORMAL HIGH (ref 65–99)
Potassium: 3.3 mmol/L — ABNORMAL LOW (ref 3.5–5.1)
Sodium: 138 mmol/L (ref 135–145)
TOTAL PROTEIN: 6.6 g/dL (ref 6.5–8.1)

## 2015-10-21 LAB — CBC WITH DIFFERENTIAL/PLATELET
BASOS ABS: 0.1 10*3/uL (ref 0–0.1)
BASOS PCT: 1 %
EOS ABS: 0.2 10*3/uL (ref 0–0.7)
EOS PCT: 2 %
HEMATOCRIT: 31.6 % — AB (ref 35.0–47.0)
Hemoglobin: 10.6 g/dL — ABNORMAL LOW (ref 12.0–16.0)
Lymphocytes Relative: 20 %
Lymphs Abs: 1.7 10*3/uL (ref 1.0–3.6)
MCH: 31 pg (ref 26.0–34.0)
MCHC: 33.5 g/dL (ref 32.0–36.0)
MCV: 92.3 fL (ref 80.0–100.0)
MONO ABS: 0.8 10*3/uL (ref 0.2–0.9)
MONOS PCT: 10 %
NEUTROS ABS: 5.7 10*3/uL (ref 1.4–6.5)
Neutrophils Relative %: 67 %
PLATELETS: 250 10*3/uL (ref 150–440)
RBC: 3.43 MIL/uL — ABNORMAL LOW (ref 3.80–5.20)
RDW: 14.1 % (ref 11.5–14.5)
WBC: 8.5 10*3/uL (ref 3.6–11.0)

## 2015-10-21 LAB — LIPASE, BLOOD: LIPASE: 14 U/L (ref 11–51)

## 2015-10-21 LAB — BRAIN NATRIURETIC PEPTIDE: B NATRIURETIC PEPTIDE 5: 89 pg/mL (ref 0.0–100.0)

## 2015-10-21 LAB — TROPONIN I

## 2015-10-21 MED ORDER — IOPAMIDOL (ISOVUE-300) INJECTION 61%
30.0000 mL | Freq: Once | INTRAVENOUS | Status: AC | PRN
  Administered 2015-10-21: 30 mL via ORAL

## 2015-10-21 MED ORDER — IOPAMIDOL (ISOVUE-300) INJECTION 61%
80.0000 mL | Freq: Once | INTRAVENOUS | Status: AC | PRN
  Administered 2015-10-21: 80 mL via INTRAVENOUS

## 2015-10-21 MED ORDER — CEPHALEXIN 500 MG PO CAPS: 500.0000 mg | ORAL_CAPSULE | Freq: Four times a day (QID) | ORAL | 0 refills | Status: AC

## 2015-10-21 NOTE — ED Notes (Addendum)
2 unsuccessful PIV attempts by this RN (LEFT hand and LEFT AC)

## 2015-10-21 NOTE — ED Provider Notes (Signed)
[facility] Emergency Department Provider Note   ____________________________________________   First MD Initiated Contact with Patient 10/20/15 2358     (approximate)  I have reviewed the triage vital signs and the nursing notes.   HISTORY  Chief Complaint No chief complaint on file.   HPI Lisa Beard is a 82 y.o. female who reports she's been having trouble swallowing and food and water both been getting stuck first in her mid chest and then she's also been having trouble getting in through her throat. She feels somewhat bloated. She went to [facility] recently after referral from her gastroenterologist and they told her she had a mass on her pancreas that was due to wear and tear because of her age. She has some belly pain now. She says she feels bloated. Her main complaint however is the feeling of food and water not going down in her chest is feeling somewhat tight. This first started this past Saturday and has gotten worse the last few days.   Past Medical History:  Diagnosis Date  . Anemia   . Arthritis   . Asthma   . Chronic kidney disease   . COPD (chronic obstructive pulmonary disease) ([place])   . Coronary artery disease   . Diabetes mellitus without complication ([place])   . GERD (gastroesophageal reflux disease)   . Hypertension   . Renal insufficiency     Patient Active Problem List   Diagnosis Date Noted  . Chronic renal insufficiency 10/27/2014  . Hyponatremia 10/27/2014  . Leukocytosis 10/27/2014  . Left shoulder pain 10/27/2014  . Atypical chest pain 10/25/2014  . Abnormal EKG 10/25/2014  . Chest pain 10/25/2014  . General weakness 05/25/2014  . Elevated troponin 05/25/2014  . CAD (coronary artery disease) 05/25/2014  . HTN (hypertension) 05/25/2014  . DM (diabetes mellitus) ([place]) 05/25/2014  . COPD (chronic obstructive pulmonary disease) ([place]) 05/25/2014  . CKD (chronic kidney disease) 05/25/2014  . Chronic anemia 05/25/2014     Past Surgical History:  Procedure Laterality Date  . ABDOMINAL HYSTERECTOMY    . CHOLECYSTECTOMY    . EYE SURGERY      Prior to Admission medications   Medication Sig Start Date End Date Taking? Authorizing Provider  acetaminophen (TYLENOL) 325 MG tablet Take 325 mg by mouth every 6 (six) hours as needed for mild pain, moderate pain or fever.    Historical Provider, MD  aspirin EC 81 MG tablet Take 1 tablet (81 mg total) by mouth daily. 10/27/14   Theodoro Grist, MD  atorvastatin (LIPITOR) 20 MG tablet Take 1 tablet (20 mg total) by mouth daily. 10/27/14   Theodoro Grist, MD  cephALEXin (KEFLEX) 500 MG capsule Take 1 capsule (500 mg total) by mouth 4 (four) times daily. 10/21/15 10/31/15  Nena Polio, MD  Cholecalciferol (VITAMIN D3) 1000 UNITS CAPS Take 1,000 Units by mouth daily.    Historical Provider, MD  diphenoxylate-atropine (LOMOTIL) 2.5-0.025 MG per tablet Take 2 tablets by mouth See admin instructions. Take 2 tablets orally once a day in the morning. *Can take up to 8 tablets daily*    Historical Provider, MD  Fluticasone-Salmeterol (ADVAIR) 250-50 MCG/DOSE AEPB Inhale 1 puff into the lungs 2 (two) times daily.    Historical Provider, MD  isosorbide mononitrate (IMDUR) 30 MG 24 hr tablet Take 1 tablet (30 mg total) by mouth daily. 10/27/14   Theodoro Grist, MD  metoprolol succinate (TOPROL-XL) 25 MG 24 hr tablet Take 25 mg by mouth daily.  Historical Provider, MD  pantoprazole (PROTONIX) 40 MG tablet Take 40 mg by mouth daily.    Historical Provider, MD  sitaGLIPtin (JANUVIA) 50 MG tablet Take 50 mg by mouth daily.    Historical Provider, MD  spironolactone (ALDACTONE) 25 MG tablet Take 25 mg by mouth daily.    Historical Provider, MD  torsemide (DEMADEX) 10 MG tablet Take 10 mg by mouth 2 (two) times daily.     Historical Provider, MD  traZODone (DESYREL) 50 MG tablet Take 50 mg by mouth at bedtime.    Historical Provider, MD    Allergies Biaxin [clarithromycin] and  Boniva [ibandronic acid]  Family History  Problem Relation Age of Onset  . CAD Mother   . Cancer Sister   . Hypertension Sister     Social History Social History  Substance Use Topics  . Smoking status: Never Smoker  . Smokeless tobacco: Never Used  . Alcohol use No    Review of Systems Constitutional: No fever/chills Eyes: No visual changes. ENT: No sore throat. Cardiovascular:See history of present illness Respiratory: Denies shortness of breath. Gastrointestinal: See history of present illness No nausea, no vomiting.  No diarrhea.  No constipation. Genitourinary: Negative for dysuria. Musculoskeletal: Negative for back pain. Skin: Negative for rash. Neurological: Negative for headaches, focal weakness or numbness.  10-point ROS otherwise negative.  ____________________________________________   PHYSICAL EXAM:  VITAL SIGNS: ED Triage Vitals  Enc Vitals Group     BP 10/20/15 2317 (!) 136/108     Pulse Rate 10/20/15 2317 63     Resp 10/20/15 2317 20     Temp 10/20/15 2317 98.4 F (36.9 C)     Temp Source 10/20/15 2317 Oral     SpO2 10/20/15 2317 99 %     Weight 10/20/15 2317 170 lb (77.1 kg)     Height 10/20/15 2317 5\' 2"  (1.575 m)     Head Circumference --      Peak Flow --      Pain Score 10/20/15 2338 0     Pain Loc --      Pain Edu? --      Excl. in [place]? --     Constitutional: Alert and oriented. Well appearing and in no acute distress. Eyes: Conjunctivae are normal. PERRL. EOMI. Head: Atraumatic. Nose: No congestion/rhinnorhea. Mouth/Throat: Mucous membranes are moist.  Oropharynx non-erythematous. Neck: No stridor. Cardiovascular: Normal rate, regular rhythm. Grossly normal heart sounds.  Good peripheral circulation. Respiratory: Normal respiratory effort.  No retractions. Lungs CTAB. Gastrointestinal: Soft Mild diffusely tender No distention. No abdominal bruits. No CVA tenderness. Musculoskeletal: No lower extremity tenderness to plus edema  bilaterally. The patient reports her legs little less swollen than previously. There is some warmth and redness on the lateral part of the left leg about the size the palm of my hand. No joint effusions. Neurologic:  Normal speech and language. No gross focal neurologic deficits are appreciated.  Skin:  Skin is warm, dry and intact. No rash noted. Psychiatric: Mood and affect are normal. Speech and behavior are normal.  ____________________________________________   LABS (all labs ordered are listed, but only abnormal results are displayed)  Labs Reviewed  COMPREHENSIVE METABOLIC PANEL - Abnormal; Notable for the following:       Result Value   Potassium 3.3 (*)    Glucose, Bld 118 (*)    Creatinine, Ser 1.34 (*)    Calcium 8.2 (*)    Albumin 3.3 (*)    Alkaline Phosphatase 129 (*)  Total Bilirubin 0.2 (*)    GFR calc non Af Amer 35 (*)    GFR calc Af Amer 40 (*)    All other components within normal limits  CBC WITH DIFFERENTIAL/PLATELET - Abnormal; Notable for the following:    RBC 3.43 (*)    Hemoglobin 10.6 (*)    HCT 31.6 (*)    All other components within normal limits  LIPASE, BLOOD  TROPONIN I  BRAIN NATRIURETIC PEPTIDE  CBC WITH DIFFERENTIAL/PLATELET  TROPONIN I   ____________________________________________  EKG  KG read and interpreted by me shows normal sinus rhythm rate of 68 left axis right bundle branch block ____________________________________________  RADIOLOGY  Study Result   CLINICAL DATA:  Difficulty swallowing.  Abdominal pain.  EXAM: CT CHEST, ABDOMEN, AND PELVIS WITH CONTRAST  TECHNIQUE: Multidetector CT imaging of the chest, abdomen and pelvis was performed following the standard protocol during bolus administration of intravenous contrast.  CONTRAST:  63mL ISOVUE-300 IOPAMIDOL (ISOVUE-300) INJECTION 61%  COMPARISON:  CT abdomen/ pelvis 02/26/2015  FINDINGS: CT CHEST FINDINGS  Cardiovascular: Tortuosity and atherosclerosis  of thoracic aorta, no aneurysm. Coronary artery calcifications versus stents. Heart is normal in size. No pericardial fluid.  Mediastinum/Nodes: No mediastinal, hilar, or axillary adenopathy. Esophagus mildly patulous containing intraluminal contrast in its midportion trachea is patent. Subcentimeter nodule in the left lobe of the thyroid does not meet size criteria for biopsy.  Lungs/Pleura: The lungs are clear. No consolidation, pulmonary mass or pulmonary edema. No pleural fluid.  Musculoskeletal: Hemangioma within T3 vertebral body. There are no acute or suspicious osseous abnormalities.  CT ABDOMEN PELVIS FINDINGS  Hepatobiliary: No focal hepatic lesion. Postcholecystectomy. No common bile duct dilatation.  Pancreas: Multi cystic lesion in the pancreatic head, mild increase in size currently 5.1 x 4.9 cm, previously 4.9 x 3.6 cm. There thickened septations. Progressive pancreatic ductal dilatation measuring up to 2.1 cm. The papilla extends into the the duodenal lumen, increased. No peripancreatic inflammation.  Spleen: Normal in size without focal abnormality.  Adrenals/Urinary Tract: No adrenal nodule. Symmetric renal enhancement. Prominence of the right renal pelvis, however there is symmetric renal excretion on delayed phase imaging. Urinary bladder is physiologically distended.  Stomach/Bowel: Small hiatal hernia. Stomach physiologically distended. No bowel obstruction. No bowel inflammation or distention. Moderate stool burden throughout the colon with colonic tortuosity. Appendix not visualized.  Vascular/Lymphatic: Aortic atherosclerosis. No enlarged abdominal or pelvic lymph nodes.  Reproductive: Status post hysterectomy. No adnexal masses.  Other: No free air, free fluid, or intra-abdominal fluid collection.  Musculoskeletal: There are no acute or suspicious osseous abnormalities. Degenerative change throughout the spine and right greater  than left hip.  IMPRESSION: 1. Enteric contrast in the midesophagus which is patulous, may be reflux or delayed transit. Small hiatal hernia. 2. Increased size of multicystic pancreatic head lesion with progressive pancreatic ductal dilatation most consistent with main duct intraductal papillary mucinous neoplasm. 3. No acute abnormality in the chest, abdomen, or pelvis. 4. Atherosclerosis including coronary artery calcifications.   Electronically Signed   By: Jeb Levering M.D.   On: 10/21/2015 03:04    Result   CLINICAL DATA:  81 y/o F; difficulty swallowing over the last few days with feeling of something stuck in the throat.  EXAM: CT NECK WITH CONTRAST  TECHNIQUE: Multidetector CT imaging of the neck was performed using the standard protocol following the bolus administration of intravenous contrast.  CONTRAST:  85mL ISOVUE-300 IOPAMIDOL (ISOVUE-300) INJECTION 61%  COMPARISON:  None.  FINDINGS: Pharynx and larynx: Normal.  No mass or swelling. No radiopaque foreign object identified.  Salivary glands: No inflammation, mass, or stone.  Thyroid: 9 mm left thyroid lobe nodule. There is a track of thyroid tissue extending in the midline from the isthmus the level of the hyoid (series 8, image 37).  Lymph nodes: None enlarged or abnormal density.  Vascular: Moderate calcified plaque of bilateral carotid bifurcations without significant stenosis. Brief retropharyngeal submucosal course of right internal carotid artery. Extensive calcified plaque of bilateral V4 segments with probable mild to moderate underlying stenosis. No internal jugular vein thrombosis.  Limited intracranial: Negative.  Visualized orbits: Negative. Bilateral intra-ocular lens replacement.  Mastoids and visualized paranasal sinuses: Mucosal thickening of the right maxillary sinus. Otherwise the visualized paranasal sinuses and mastoid air cells are clear.  Skeleton:  Moderate cervical spondylosis with straightening of cervical lordosis, grade 1 C2-3 anterolisthesis, endplate degenerative changes and disc space narrowing from C4 through C7, and multilevel facet arthrosis. There is multilevel right-greater-than-left foraminal narrowing which is moderate to severe at the C3 through C6 levels. Additionally, there is multilevel canal stenosis greatest at C5-6 where it is at least moderate.  Upper chest: Right lung apex 4 mm calcified granuloma. Otherwise negative.  Other: None  IMPRESSION: 1. No mass or inflammatory process of the pharynx or larynx is identified. No radiopaque foreign object. 2. Degenerative changes of the cervical spine greatest at C5-6 with there is at least moderate canal stenosis.   Electronically Signed   By: Kristine Garbe M.D.   On: 10/21/2015 02:58     ____________________________________________   PROCEDURES  Procedure(s) performed:   Procedures  Critical Care performed:  ____________________________________________   INITIAL IMPRESSION / ASSESSMENT AND PLAN / ED COURSE  Pertinent labs & imaging results that were available during my care of the patient were reviewed by me and considered in my medical decision making (see chart for details).    Clinical Course     ____________________________________________   FINAL CLINICAL IMPRESSION(S) / ED DIAGNOSES  Final diagnoses:  Other dysphagia  Cellulitis, unspecified cellulitis site      NEW MEDICATIONS STARTED DURING THIS VISIT:  New Prescriptions   CEPHALEXIN (KEFLEX) 500 MG CAPSULE    Take 1 capsule (500 mg total) by mouth 4 (four) times daily.     Note:  This document was prepared using Dragon voice recognition software and may include unintentional dictation errors.    Nena Polio, MD 10/21/15 [phone]

## 2015-10-21 NOTE — Discharge Instructions (Signed)
The CAT scans do not show any blockage in your throat or esophagus. The pancreas mass is a little bit larger. I recommend that you please follow-up with your gastroenterologist. Call them in the morning. I will also give you a prescription for Keflex for the redness and warmth that you're having on your left leg. Please return if this gets worse or is not better in 3 or 4 days.

## 2015-11-11 DIAGNOSIS — E538 Deficiency of other specified B group vitamins: Secondary | ICD-10-CM | POA: Diagnosis not present

## 2015-12-13 DIAGNOSIS — D490 Neoplasm of unspecified behavior of digestive system: Secondary | ICD-10-CM | POA: Diagnosis not present

## 2015-12-13 DIAGNOSIS — R1013 Epigastric pain: Secondary | ICD-10-CM | POA: Diagnosis not present

## 2015-12-16 DIAGNOSIS — E1122 Type 2 diabetes mellitus with diabetic chronic kidney disease: Secondary | ICD-10-CM | POA: Diagnosis not present

## 2015-12-16 DIAGNOSIS — E538 Deficiency of other specified B group vitamins: Secondary | ICD-10-CM | POA: Diagnosis not present

## 2015-12-16 DIAGNOSIS — N183 Chronic kidney disease, stage 3 (moderate): Secondary | ICD-10-CM | POA: Diagnosis not present

## 2015-12-16 DIAGNOSIS — D649 Anemia, unspecified: Secondary | ICD-10-CM | POA: Diagnosis not present

## 2015-12-16 DIAGNOSIS — E7800 Pure hypercholesterolemia, unspecified: Secondary | ICD-10-CM | POA: Diagnosis not present

## 2015-12-20 DIAGNOSIS — E1122 Type 2 diabetes mellitus with diabetic chronic kidney disease: Secondary | ICD-10-CM | POA: Diagnosis not present

## 2015-12-20 DIAGNOSIS — R809 Proteinuria, unspecified: Secondary | ICD-10-CM | POA: Diagnosis not present

## 2015-12-20 DIAGNOSIS — N183 Chronic kidney disease, stage 3 (moderate): Secondary | ICD-10-CM | POA: Diagnosis not present

## 2015-12-20 DIAGNOSIS — I129 Hypertensive chronic kidney disease with stage 1 through stage 4 chronic kidney disease, or unspecified chronic kidney disease: Secondary | ICD-10-CM | POA: Diagnosis not present

## 2015-12-20 DIAGNOSIS — D631 Anemia in chronic kidney disease: Secondary | ICD-10-CM | POA: Diagnosis not present

## 2015-12-23 DIAGNOSIS — E538 Deficiency of other specified B group vitamins: Secondary | ICD-10-CM | POA: Diagnosis not present

## 2015-12-23 DIAGNOSIS — E1140 Type 2 diabetes mellitus with diabetic neuropathy, unspecified: Secondary | ICD-10-CM | POA: Diagnosis not present

## 2015-12-23 DIAGNOSIS — E1122 Type 2 diabetes mellitus with diabetic chronic kidney disease: Secondary | ICD-10-CM | POA: Diagnosis not present

## 2015-12-23 DIAGNOSIS — I10 Essential (primary) hypertension: Secondary | ICD-10-CM | POA: Diagnosis not present

## 2015-12-23 DIAGNOSIS — J449 Chronic obstructive pulmonary disease, unspecified: Secondary | ICD-10-CM | POA: Diagnosis not present

## 2015-12-23 DIAGNOSIS — K219 Gastro-esophageal reflux disease without esophagitis: Secondary | ICD-10-CM | POA: Diagnosis not present

## 2015-12-23 DIAGNOSIS — F5101 Primary insomnia: Secondary | ICD-10-CM | POA: Diagnosis not present

## 2015-12-23 DIAGNOSIS — D649 Anemia, unspecified: Secondary | ICD-10-CM | POA: Diagnosis not present

## 2015-12-23 DIAGNOSIS — E7800 Pure hypercholesterolemia, unspecified: Secondary | ICD-10-CM | POA: Diagnosis not present

## 2016-01-17 DIAGNOSIS — E538 Deficiency of other specified B group vitamins: Secondary | ICD-10-CM | POA: Diagnosis not present

## 2016-02-14 DIAGNOSIS — R1084 Generalized abdominal pain: Secondary | ICD-10-CM | POA: Diagnosis not present

## 2016-02-14 DIAGNOSIS — D490 Neoplasm of unspecified behavior of digestive system: Secondary | ICD-10-CM | POA: Diagnosis not present

## 2016-02-14 DIAGNOSIS — R634 Abnormal weight loss: Secondary | ICD-10-CM | POA: Diagnosis not present

## 2016-02-14 DIAGNOSIS — R1011 Right upper quadrant pain: Secondary | ICD-10-CM | POA: Diagnosis not present

## 2016-02-17 DIAGNOSIS — E538 Deficiency of other specified B group vitamins: Secondary | ICD-10-CM | POA: Diagnosis not present

## 2016-02-28 ENCOUNTER — Inpatient Hospital Stay
Admission: EM | Admit: 2016-02-28 | Discharge: 2016-03-01 | DRG: 641 | Disposition: A | Discharge status: Hospice | Payer: Medicare HMO | Attending: Internal Medicine | Admitting: Internal Medicine

## 2016-02-28 ENCOUNTER — Emergency Department: Payer: Medicare HMO

## 2016-02-28 ENCOUNTER — Emergency Department
Admission: EM | Admit: 2016-02-28 | Discharge: 2016-02-28 | Discharge status: Absent without leave | Payer: Medicare HMO | Source: Home / Self Care | Attending: Emergency Medicine | Admitting: Emergency Medicine

## 2016-02-28 DIAGNOSIS — E1122 Type 2 diabetes mellitus with diabetic chronic kidney disease: Secondary | ICD-10-CM | POA: Diagnosis present

## 2016-02-28 DIAGNOSIS — I2510 Atherosclerotic heart disease of native coronary artery without angina pectoris: Secondary | ICD-10-CM | POA: Diagnosis present

## 2016-02-28 DIAGNOSIS — Z90710 Acquired absence of both cervix and uterus: Secondary | ICD-10-CM | POA: Diagnosis not present

## 2016-02-28 DIAGNOSIS — Z7951 Long term (current) use of inhaled steroids: Secondary | ICD-10-CM | POA: Diagnosis not present

## 2016-02-28 DIAGNOSIS — R110 Nausea: Secondary | ICD-10-CM | POA: Diagnosis present

## 2016-02-28 DIAGNOSIS — E876 Hypokalemia: Secondary | ICD-10-CM | POA: Diagnosis not present

## 2016-02-28 DIAGNOSIS — Z888 Allergy status to other drugs, medicaments and biological substances status: Secondary | ICD-10-CM

## 2016-02-28 DIAGNOSIS — Z79899 Other long term (current) drug therapy: Secondary | ICD-10-CM | POA: Diagnosis not present

## 2016-02-28 DIAGNOSIS — K219 Gastro-esophageal reflux disease without esophagitis: Secondary | ICD-10-CM | POA: Diagnosis present

## 2016-02-28 DIAGNOSIS — I824Z9 Acute embolism and thrombosis of unspecified deep veins of unspecified distal lower extremity: Secondary | ICD-10-CM | POA: Diagnosis not present

## 2016-02-28 DIAGNOSIS — K8689 Other specified diseases of pancreas: Secondary | ICD-10-CM

## 2016-02-28 DIAGNOSIS — R197 Diarrhea, unspecified: Secondary | ICD-10-CM | POA: Diagnosis present

## 2016-02-28 DIAGNOSIS — M1990 Unspecified osteoarthritis, unspecified site: Secondary | ICD-10-CM | POA: Diagnosis present

## 2016-02-28 DIAGNOSIS — R4182 Altered mental status, unspecified: Secondary | ICD-10-CM | POA: Diagnosis present

## 2016-02-28 DIAGNOSIS — E860 Dehydration: Secondary | ICD-10-CM | POA: Diagnosis not present

## 2016-02-28 DIAGNOSIS — N183 Chronic kidney disease, stage 3 (moderate): Secondary | ICD-10-CM | POA: Diagnosis present

## 2016-02-28 DIAGNOSIS — Z7984 Long term (current) use of oral hypoglycemic drugs: Secondary | ICD-10-CM | POA: Diagnosis not present

## 2016-02-28 DIAGNOSIS — I82409 Acute embolism and thrombosis of unspecified deep veins of unspecified lower extremity: Secondary | ICD-10-CM

## 2016-02-28 DIAGNOSIS — E871 Hypo-osmolality and hyponatremia: Secondary | ICD-10-CM | POA: Diagnosis not present

## 2016-02-28 DIAGNOSIS — Z66 Do not resuscitate: Secondary | ICD-10-CM | POA: Diagnosis present

## 2016-02-28 DIAGNOSIS — R1013 Epigastric pain: Secondary | ICD-10-CM | POA: Diagnosis not present

## 2016-02-28 DIAGNOSIS — Z515 Encounter for palliative care: Secondary | ICD-10-CM | POA: Diagnosis not present

## 2016-02-28 DIAGNOSIS — Z7189 Other specified counseling: Secondary | ICD-10-CM

## 2016-02-28 DIAGNOSIS — Z8249 Family history of ischemic heart disease and other diseases of the circulatory system: Secondary | ICD-10-CM

## 2016-02-28 DIAGNOSIS — J449 Chronic obstructive pulmonary disease, unspecified: Secondary | ICD-10-CM | POA: Diagnosis not present

## 2016-02-28 DIAGNOSIS — R1084 Generalized abdominal pain: Secondary | ICD-10-CM

## 2016-02-28 DIAGNOSIS — I129 Hypertensive chronic kidney disease with stage 1 through stage 4 chronic kidney disease, or unspecified chronic kidney disease: Secondary | ICD-10-CM | POA: Diagnosis present

## 2016-02-28 DIAGNOSIS — R739 Hyperglycemia, unspecified: Secondary | ICD-10-CM | POA: Diagnosis not present

## 2016-02-28 DIAGNOSIS — R627 Adult failure to thrive: Secondary | ICD-10-CM | POA: Diagnosis present

## 2016-02-28 DIAGNOSIS — M7989 Other specified soft tissue disorders: Secondary | ICD-10-CM | POA: Diagnosis not present

## 2016-02-28 DIAGNOSIS — F329 Major depressive disorder, single episode, unspecified: Secondary | ICD-10-CM | POA: Diagnosis present

## 2016-02-28 DIAGNOSIS — D649 Anemia, unspecified: Secondary | ICD-10-CM | POA: Diagnosis not present

## 2016-02-28 DIAGNOSIS — C259 Malignant neoplasm of pancreas, unspecified: Secondary | ICD-10-CM | POA: Diagnosis present

## 2016-02-28 DIAGNOSIS — Z809 Family history of malignant neoplasm, unspecified: Secondary | ICD-10-CM

## 2016-02-28 DIAGNOSIS — R531 Weakness: Secondary | ICD-10-CM | POA: Diagnosis not present

## 2016-02-28 DIAGNOSIS — Z881 Allergy status to other antibiotic agents status: Secondary | ICD-10-CM

## 2016-02-28 DIAGNOSIS — R630 Anorexia: Secondary | ICD-10-CM | POA: Diagnosis present

## 2016-02-28 DIAGNOSIS — R109 Unspecified abdominal pain: Secondary | ICD-10-CM | POA: Diagnosis not present

## 2016-02-28 DIAGNOSIS — K869 Disease of pancreas, unspecified: Secondary | ICD-10-CM | POA: Diagnosis not present

## 2016-02-28 LAB — CBC WITH DIFFERENTIAL/PLATELET
BASOS ABS: 0 10*3/uL (ref 0–0.1)
Basophils Relative: 1 %
Eosinophils Absolute: 0.1 10*3/uL (ref 0–0.7)
Eosinophils Relative: 1 %
HCT: 34.1 % — ABNORMAL LOW (ref 35.0–47.0)
HEMOGLOBIN: 11.3 g/dL — AB (ref 12.0–16.0)
Lymphocytes Relative: 14 %
Lymphs Abs: 1.2 10*3/uL (ref 1.0–3.6)
MCH: 30.4 pg (ref 26.0–34.0)
MCHC: 33 g/dL (ref 32.0–36.0)
MCV: 92 fL (ref 80.0–100.0)
Monocytes Absolute: 0.7 10*3/uL (ref 0.2–0.9)
Monocytes Relative: 8 %
NEUTROS ABS: 6.7 10*3/uL — AB (ref 1.4–6.5)
Neutrophils Relative %: 76 %
Platelets: 263 10*3/uL (ref 150–440)
RBC: 3.71 MIL/uL — ABNORMAL LOW (ref 3.80–5.20)
RDW: 14.1 % (ref 11.5–14.5)
WBC: 8.7 10*3/uL (ref 3.6–11.0)

## 2016-02-28 NOTE — ED Triage Notes (Signed)
Pt bib EMS from home w/ c/o weakness and flu like s/s.  Per EMS pt sts pt "unwell" x 3 days, had diarrhea today and elevated blood sugar today.  Pt alert, confused and family on way per EMS

## 2016-02-28 NOTE — ED Provider Notes (Signed)
[facility] Emergency Department Provider Note   First MD Initiated Contact with Patient 02/28/16 2335     (approximate)  I have reviewed the triage vital signs and the nursing notes.  History Limited secondary to altered mental status HISTORY  Chief Complaint Cough and Weakness    HPI Lisa Beard is a 83 y.o. female with local list of primary medical conditions presents to the emergency department with "not feeling well". Per EMS on their arrival multiple family member states that the patient has had congestion and cough one episode of diarrhea today. Patient states she has no idea why her family called EMS.   Past Medical History:  Diagnosis Date  . Anemia   . Arthritis   . Asthma   . Chronic kidney disease   . COPD (chronic obstructive pulmonary disease) ([place])   . Coronary artery disease   . Diabetes mellitus without complication ([place])   . GERD (gastroesophageal reflux disease)   . Hypertension   . Renal insufficiency     Patient Active Problem List   Diagnosis Date Noted  . Hypokalemia 02/29/2016  . Chronic renal insufficiency 10/27/2014  . Hyponatremia 10/27/2014  . Leukocytosis 10/27/2014  . Left shoulder pain 10/27/2014  . Atypical chest pain 10/25/2014  . Abnormal EKG 10/25/2014  . Chest pain 10/25/2014  . General weakness 05/25/2014  . Elevated troponin 05/25/2014  . CAD (coronary artery disease) 05/25/2014  . HTN (hypertension) 05/25/2014  . DM (diabetes mellitus) ([place]) 05/25/2014  . COPD (chronic obstructive pulmonary disease) ([place]) 05/25/2014  . CKD (chronic kidney disease) 05/25/2014  . Chronic anemia 05/25/2014    Past Surgical History:  Procedure Laterality Date  . ABDOMINAL HYSTERECTOMY    . CHOLECYSTECTOMY    . EYE SURGERY      Prior to Admission medications   Medication Sig Start Date End Date Taking? Authorizing Provider  acetaminophen (TYLENOL) 325 MG tablet Take 325 mg by mouth every 6 (six) hours as needed  for mild pain, moderate pain or fever.   Yes Historical Provider, MD  atorvastatin (LIPITOR) 20 MG tablet Take 1 tablet (20 mg total) by mouth daily. 10/27/14  Yes Theodoro Grist, MD  Cholecalciferol (VITAMIN D3) 1000 UNITS CAPS Take 1,000 Units by mouth daily.   Yes Historical Provider, MD  diphenoxylate-atropine (LOMOTIL) 2.5-0.025 MG per tablet Take 2 tablets by mouth See admin instructions. Take 2 tablets orally once a day in the morning. *Can take up to 8 tablets daily*   Yes Historical Provider, MD  Fluticasone-Salmeterol (ADVAIR) 250-50 MCG/DOSE AEPB Inhale 1 puff into the lungs 2 (two) times daily.   Yes Historical Provider, MD  isosorbide mononitrate (IMDUR) 30 MG 24 hr tablet Take 1 tablet (30 mg total) by mouth daily. 10/27/14  Yes Theodoro Grist, MD  metoprolol succinate (TOPROL-XL) 25 MG 24 hr tablet Take 25 mg by mouth daily.   Yes Historical Provider, MD  Pancrelipase, Lip-Prot-Amyl, (CREON) 24000-76000 units CPEP Take 1 capsule by mouth 2 (two) times daily. 01/31/16  Yes Historical Provider, MD  pantoprazole (PROTONIX) 40 MG tablet Take 40 mg by mouth daily.   Yes Historical Provider, MD  sitaGLIPtin (JANUVIA) 50 MG tablet Take 50 mg by mouth daily.   Yes Historical Provider, MD  spironolactone (ALDACTONE) 25 MG tablet Take 25 mg by mouth daily.   Yes Historical Provider, MD  torsemide (DEMADEX) 10 MG tablet Take 10 mg by mouth 2 (two) times daily.    Yes Historical Provider, MD  traZODone (  DESYREL) 50 MG tablet Take 50 mg by mouth at bedtime.   Yes Historical Provider, MD  aspirin EC 81 MG tablet Take 1 tablet (81 mg total) by mouth daily. Patient not taking: Reported on 02/29/2016 10/27/14   Theodoro Grist, MD    Allergies Biaxin [clarithromycin] and Boniva [ibandronic acid]  Family History  Problem Relation Age of Onset  . CAD Mother   . Cancer Sister   . Hypertension Sister     Social History Social History  Substance Use Topics  . Smoking status: Never Smoker  .  Smokeless tobacco: Never Used  . Alcohol use No    Review of Systems Constitutional: No fever/chills Eyes: No visual changes. ENT: No sore throat. Cardiovascular: Denies chest pain. Respiratory: Denies shortness of breath. Gastrointestinal: No abdominal pain.  No nausea, no vomiting.  No diarrhea.  No constipation. Genitourinary: Negative for dysuria. Musculoskeletal: Negative for back pain. Skin: Negative for rash. Neurological: Negative for headaches, focal weakness or numbness.  10-point ROS otherwise negative.  ____________________________________________   PHYSICAL EXAM:  VITAL SIGNS: ED Triage Vitals [02/28/16 2337]  Enc Vitals Group     BP (!) 153/79     Pulse Rate 84     Resp 20     Temp 98.2 F (36.8 C)     Temp Source Oral     SpO2 96 %     Weight 160 lb (72.6 kg)     Height      Head Circumference      Peak Flow      Pain Score 0     Pain Loc      Pain Edu?      Excl. in [place]?     Constitutional: Alert and Well appearing and in no acute distress. Eyes: Conjunctivae are normal. PERRL. EOMI. Head: Atraumatic. Nose: No congestion/rhinnorhea. Mouth/Throat: Mucous membranes are dry .Oropharynx non-erythematous. Neck: No stridor.  No meningeal signs.  No cervical spine tenderness to palpation. Cardiovascular: Normal rate, regular rhythm. Good peripheral circulation. Grossly normal heart sounds. Respiratory: Normal respiratory effort.  No retractions. Lungs CTAB. Gastrointestinal: Epigastric abdominal tenderness palpation. No distention.  Musculoskeletal: No lower extremity tenderness nor edema. No gross deformities of extremities. Neurologic:  Normal speech and language. No gross focal neurologic deficits are appreciated.  Skin:  Skin is warm, dry and intact. No rash noted. Psychiatric: Mood and affect are normal. Speech and behavior are normal.  ____________________________________________   LABS (all labs ordered are listed, but only abnormal results  are displayed)  Labs Reviewed  CBC WITH DIFFERENTIAL/PLATELET - Abnormal; Notable for the following:       Result Value   RBC 3.71 (*)    Hemoglobin 11.3 (*)    HCT 34.1 (*)    Neutro Abs 6.7 (*)    All other components within normal limits  URINALYSIS, COMPLETE (UACMP) WITH MICROSCOPIC - Abnormal; Notable for the following:    Color, Urine COLORLESS (*)    APPearance CLEAR (*)    Specific Gravity, Urine 1.000 (*)    Glucose, UA >=500 (*)    Hgb urine dipstick SMALL (*)    Squamous Epithelial / LPF 0-5 (*)    All other components within normal limits  COMPREHENSIVE METABOLIC PANEL - Abnormal; Notable for the following:    Sodium 133 (*)    Potassium 2.8 (*)    Chloride 100 (*)    Glucose, Bld 341 (*)    Creatinine, Ser 1.53 (*)    Calcium 8.1 (*)  Total Protein 6.4 (*)    Albumin 3.3 (*)    ALT 12 (*)    GFR calc non Af Amer 29 (*)    GFR calc Af Amer 34 (*)    All other components within normal limits  INFLUENZA PANEL BY PCR (TYPE A & B)  POTASSIUM  CBC  BASIC METABOLIC PANEL   ____________________________________________  EKG  ED ECG REPORT I, [place] N BROWN, the attending physician, personally viewed and interpreted this ECG.   Date: 02/28/2016  EKG Time: 11:30 PM  Rate: 85  Rhythm: Normal sinus rhythm  Axis: Normal  Intervals: Normal  ST&T Change: None  ____________________________________________  RADIOLOGY I, Regent N BROWN, personally viewed and evaluated these images (plain radiographs) as part of my medical decision making, as well as reviewing the written report by the radiologist.  Ct Abdomen Pelvis Wo Contrast  Result Date: 02/29/2016 CLINICAL DATA:  Abdominal pain and diarrhea EXAM: CT ABDOMEN AND PELVIS WITHOUT CONTRAST TECHNIQUE: Multidetector CT imaging of the abdomen and pelvis was performed following the standard protocol without IV contrast. COMPARISON:  CT abdomen pelvis 10/21/2015 FINDINGS: Lower chest: No pulmonary nodules. No  visible pleural or pericardial effusion. Hepatobiliary: Normal hepatic size and contours. No perihepatic ascites. No intra- or extrahepatic biliary dilatation. Status post cholecystectomy Pancreas: Multilobulated, predominantly cystic mass of the pancreatic head measures 5.7 x 5.0 cm, slightly increased in size. The pancreatic duct remains dilated, unchanged. No peripancreatic fluid collection. Spleen: Normal. Adrenals/Urinary Tract: Normal adrenal glands. No hydronephrosis or nephrolithiasis. No abnormal perinephric stranding. No ureteral obstruction. Stomach/Bowel: No abnormal bowel dilatation. No bowel wall thickening or adjacent fat stranding to indicate acute inflammation. No abdominal fluid collection. There is a focus of fat attenuation within the proximal descending colon, not present on the prior examination. The appendix is not seen. Vascular/Lymphatic: There is atherosclerotic calcification of the non aneurysmal abdominal aorta. No abdominal or pelvic lymphadenopathy. Reproductive: Status post hysterectomy. No adnexal mass. No free fluid in the pelvis. Musculoskeletal: No lytic or blastic osseous lesion. Normal visualized extrathoracic and extraperitoneal soft tissues. Other: No contributory non-categorized findings. IMPRESSION: 1. No acute abnormality of the abdomen or pelvis. 2. Slightly increased size predominantly cystic pancreatic head lesion. This remains most consistent with main duct intraductal papillary mucinous neoplasm. Associated dilatation of the pancreatic duct is unchanged. 3. Aortic atherosclerosis. Electronically Signed   By: Ulyses Jarred M.D.   On: 02/29/2016 03:19   Dg Chest Portable 1 View  Result Date: 02/29/2016 CLINICAL DATA:  Acute onset of generalized weakness. Diarrhea and hyperglycemia. Confusion. Initial encounter. EXAM: PORTABLE CHEST 1 VIEW COMPARISON:  Chest radiograph performed 10/20/2015, and CT of the chest performed 10/21/2015 FINDINGS: The lungs are well-aerated.  Mild peribronchial thickening is noted. There is no evidence of focal opacification, pleural effusion or pneumothorax. The cardiomediastinal silhouette is within normal limits. No acute osseous abnormalities are seen. IMPRESSION: Mild peribronchial thickening noted.  Lungs otherwise grossly clear. Electronically Signed   By: Garald Balding M.D.   On: 02/29/2016 00:21    ____________________________________________    Procedures     INITIAL IMPRESSION / ASSESSMENT AND PLAN / ED COURSE  Pertinent labs & imaging results that were available during my care of the patient were reviewed by me and considered in my medical decision making (see chart for details).       ____________________________________________  FINAL CLINICAL IMPRESSION(S) / ED DIAGNOSES  Final diagnoses:  Generalized abdominal pain  Pancreatic mass Dehydration   MEDICATIONS GIVEN DURING THIS VISIT:  Medications  potassium chloride (KLOR-CON) packet 40 mEq (40 mEq Oral Given 02/29/16 0134)  aspirin EC tablet 81 mg (not administered)  atorvastatin (LIPITOR) tablet 20 mg (not administered)  cholecalciferol (VITAMIN D) tablet 1,000 Units (not administered)  isosorbide mononitrate (IMDUR) 24 hr tablet 30 mg (not administered)  mometasone-formoterol (DULERA) 200-5 MCG/ACT inhaler 2 puff (not administered)  metoprolol succinate (TOPROL-XL) 24 hr tablet 25 mg (not administered)  pantoprazole (PROTONIX) EC tablet 40 mg (not administered)  linagliptin (TRADJENTA) tablet 5 mg (not administered)  traZODone (DESYREL) tablet 50 mg (not administered)  sodium chloride flush (NS) 0.9 % injection 3 mL (not administered)  0.9 % NaCl with KCl 20 mEq/ L  infusion (not administered)  acetaminophen (TYLENOL) tablet 650 mg (not administered)    Or  acetaminophen (TYLENOL) suppository 650 mg (not administered)  senna-docusate (Senokot-S) tablet 1 tablet (not administered)  ondansetron (ZOFRAN) tablet 4 mg (not administered)    Or   ondansetron (ZOFRAN) injection 4 mg (not administered)  megestrol (MEGACE) tablet 40 mg (not administered)  insulin aspart (novoLOG) injection 0-15 Units (not administered)  insulin aspart (novoLOG) injection 0-5 Units (not administered)  enoxaparin (LOVENOX) injection 30 mg (not administered)  iopamidol (ISOVUE-300) 61 % injection 15 mL (15 mLs Oral Contrast Given 02/29/16 0203)  sodium chloride 0.9 % bolus 1,000 mL (0 mLs Intravenous Stopped 02/29/16 0309)  ondansetron (ZOFRAN) injection 4 mg (4 mg Intravenous Given 02/29/16 0341)     NEW OUTPATIENT MEDICATIONS STARTED DURING THIS VISIT:  Current Discharge Medication List      Current Discharge Medication List      Current Discharge Medication List       Note:  This document was prepared using Dragon voice recognition software and may include unintentional dictation errors.    Gregor Hams, MD 02/29/16 [phone]

## 2016-02-29 ENCOUNTER — Inpatient Hospital Stay: Payer: Medicare HMO

## 2016-02-29 ENCOUNTER — Emergency Department: Payer: Medicare HMO

## 2016-02-29 ENCOUNTER — Encounter: Payer: Self-pay | Admitting: Internal Medicine

## 2016-02-29 DIAGNOSIS — R4182 Altered mental status, unspecified: Secondary | ICD-10-CM | POA: Diagnosis present

## 2016-02-29 DIAGNOSIS — N183 Chronic kidney disease, stage 3 (moderate): Secondary | ICD-10-CM | POA: Diagnosis present

## 2016-02-29 DIAGNOSIS — R627 Adult failure to thrive: Secondary | ICD-10-CM | POA: Diagnosis present

## 2016-02-29 DIAGNOSIS — R110 Nausea: Secondary | ICD-10-CM | POA: Diagnosis present

## 2016-02-29 DIAGNOSIS — E876 Hypokalemia: Secondary | ICD-10-CM | POA: Diagnosis present

## 2016-02-29 DIAGNOSIS — E871 Hypo-osmolality and hyponatremia: Secondary | ICD-10-CM | POA: Diagnosis present

## 2016-02-29 DIAGNOSIS — E1122 Type 2 diabetes mellitus with diabetic chronic kidney disease: Secondary | ICD-10-CM | POA: Diagnosis present

## 2016-02-29 DIAGNOSIS — D649 Anemia, unspecified: Secondary | ICD-10-CM | POA: Diagnosis present

## 2016-02-29 DIAGNOSIS — R197 Diarrhea, unspecified: Secondary | ICD-10-CM | POA: Diagnosis present

## 2016-02-29 DIAGNOSIS — Z515 Encounter for palliative care: Secondary | ICD-10-CM

## 2016-02-29 DIAGNOSIS — M1990 Unspecified osteoarthritis, unspecified site: Secondary | ICD-10-CM | POA: Diagnosis present

## 2016-02-29 DIAGNOSIS — K869 Disease of pancreas, unspecified: Secondary | ICD-10-CM | POA: Diagnosis not present

## 2016-02-29 DIAGNOSIS — Z90710 Acquired absence of both cervix and uterus: Secondary | ICD-10-CM | POA: Diagnosis not present

## 2016-02-29 DIAGNOSIS — I2510 Atherosclerotic heart disease of native coronary artery without angina pectoris: Secondary | ICD-10-CM | POA: Diagnosis present

## 2016-02-29 DIAGNOSIS — R1084 Generalized abdominal pain: Secondary | ICD-10-CM

## 2016-02-29 DIAGNOSIS — Z66 Do not resuscitate: Secondary | ICD-10-CM | POA: Diagnosis present

## 2016-02-29 DIAGNOSIS — Z7951 Long term (current) use of inhaled steroids: Secondary | ICD-10-CM | POA: Diagnosis not present

## 2016-02-29 DIAGNOSIS — E860 Dehydration: Secondary | ICD-10-CM | POA: Diagnosis present

## 2016-02-29 DIAGNOSIS — J449 Chronic obstructive pulmonary disease, unspecified: Secondary | ICD-10-CM | POA: Diagnosis present

## 2016-02-29 DIAGNOSIS — K8689 Other specified diseases of pancreas: Secondary | ICD-10-CM

## 2016-02-29 DIAGNOSIS — F329 Major depressive disorder, single episode, unspecified: Secondary | ICD-10-CM | POA: Diagnosis present

## 2016-02-29 DIAGNOSIS — I129 Hypertensive chronic kidney disease with stage 1 through stage 4 chronic kidney disease, or unspecified chronic kidney disease: Secondary | ICD-10-CM | POA: Diagnosis present

## 2016-02-29 DIAGNOSIS — Z79899 Other long term (current) drug therapy: Secondary | ICD-10-CM | POA: Diagnosis not present

## 2016-02-29 DIAGNOSIS — K219 Gastro-esophageal reflux disease without esophagitis: Secondary | ICD-10-CM | POA: Diagnosis present

## 2016-02-29 DIAGNOSIS — Z7984 Long term (current) use of oral hypoglycemic drugs: Secondary | ICD-10-CM | POA: Diagnosis not present

## 2016-02-29 DIAGNOSIS — Z7189 Other specified counseling: Secondary | ICD-10-CM | POA: Diagnosis not present

## 2016-02-29 DIAGNOSIS — Z8249 Family history of ischemic heart disease and other diseases of the circulatory system: Secondary | ICD-10-CM | POA: Diagnosis not present

## 2016-02-29 DIAGNOSIS — C259 Malignant neoplasm of pancreas, unspecified: Secondary | ICD-10-CM | POA: Diagnosis present

## 2016-02-29 LAB — INFLUENZA PANEL BY PCR (TYPE A & B)
Influenza A By PCR: NEGATIVE
Influenza B By PCR: NEGATIVE

## 2016-02-29 LAB — BASIC METABOLIC PANEL
Anion gap: 5 (ref 5–15)
BUN: 11 mg/dL (ref 6–20)
CHLORIDE: 106 mmol/L (ref 101–111)
CO2: 24 mmol/L (ref 22–32)
CREATININE: 1.35 mg/dL — AB (ref 0.44–1.00)
Calcium: 7.3 mg/dL — ABNORMAL LOW (ref 8.9–10.3)
GFR calc non Af Amer: 34 mL/min — ABNORMAL LOW (ref >60)
GFR, EST AFRICAN AMERICAN: 40 mL/min — AB (ref >60)
GLUCOSE: 242 mg/dL — AB (ref 65–99)
Potassium: 3.9 mmol/L (ref 3.5–5.1)
Sodium: 135 mmol/L (ref 135–145)

## 2016-02-29 LAB — COMPREHENSIVE METABOLIC PANEL
ALBUMIN: 3.3 g/dL — AB (ref 3.5–5.0)
ALK PHOS: 121 U/L (ref 38–126)
ALT: 12 U/L — ABNORMAL LOW (ref 14–54)
ANION GAP: 10 (ref 5–15)
AST: 19 U/L (ref 15–41)
BUN: 13 mg/dL (ref 6–20)
CHLORIDE: 100 mmol/L — AB (ref 101–111)
CO2: 23 mmol/L (ref 22–32)
Calcium: 8.1 mg/dL — ABNORMAL LOW (ref 8.9–10.3)
Creatinine, Ser: 1.53 mg/dL — ABNORMAL HIGH (ref 0.44–1.00)
GFR calc Af Amer: 34 mL/min — ABNORMAL LOW (ref >60)
GFR calc non Af Amer: 29 mL/min — ABNORMAL LOW (ref >60)
Glucose, Bld: 341 mg/dL — ABNORMAL HIGH (ref 65–99)
POTASSIUM: 2.8 mmol/L — AB (ref 3.5–5.1)
SODIUM: 133 mmol/L — AB (ref 135–145)
Total Bilirubin: 0.5 mg/dL (ref 0.3–1.2)
Total Protein: 6.4 g/dL — ABNORMAL LOW (ref 6.5–8.1)

## 2016-02-29 LAB — CBC
HEMATOCRIT: 29.1 % — AB (ref 35.0–47.0)
HEMOGLOBIN: 10 g/dL — AB (ref 12.0–16.0)
MCH: 30.8 pg (ref 26.0–34.0)
MCHC: 34.2 g/dL (ref 32.0–36.0)
MCV: 90 fL (ref 80.0–100.0)
Platelets: 255 10*3/uL (ref 150–440)
RBC: 3.23 MIL/uL — ABNORMAL LOW (ref 3.80–5.20)
RDW: 13.9 % (ref 11.5–14.5)
WBC: 7.3 10*3/uL (ref 3.6–11.0)

## 2016-02-29 LAB — URINALYSIS, COMPLETE (UACMP) WITH MICROSCOPIC
BACTERIA UA: NONE SEEN
BILIRUBIN URINE: NEGATIVE
Glucose, UA: >=500 mg/dL — AB
KETONES UR: NEGATIVE mg/dL
Leukocytes, UA: NEGATIVE
NITRITE: NEGATIVE
PROTEIN: NEGATIVE mg/dL
SPECIFIC GRAVITY, URINE: 1 — AB (ref 1.005–1.030)
WBC UA: NONE SEEN WBC/hpf (ref 0–5)
pH: 6 (ref 5.0–8.0)

## 2016-02-29 LAB — GLUCOSE, CAPILLARY
GLUCOSE-CAPILLARY: 159 mg/dL — AB (ref 65–99)
Glucose-Capillary: 171 mg/dL — ABNORMAL HIGH (ref 65–99)
Glucose-Capillary: 174 mg/dL — ABNORMAL HIGH (ref 65–99)
Glucose-Capillary: 185 mg/dL — ABNORMAL HIGH (ref 65–99)
Glucose-Capillary: 335 mg/dL — ABNORMAL HIGH (ref 65–99)

## 2016-02-29 LAB — POTASSIUM: Potassium: 4.2 mmol/L (ref 3.5–5.1)

## 2016-02-29 MED ORDER — ENOXAPARIN SODIUM 30 MG/0.3ML ~~LOC~~ SOLN
30.0000 mg | SUBCUTANEOUS | Status: DC
  Administered 2016-02-29: 30 mg via SUBCUTANEOUS
  Filled 2016-02-29: qty 0.3

## 2016-02-29 MED ORDER — SODIUM CHLORIDE 0.9 % IV BOLUS (SEPSIS)
1000.0000 mL | Freq: Once | INTRAVENOUS | Status: AC
  Administered 2016-02-29: 1000 mL via INTRAVENOUS

## 2016-02-29 MED ORDER — ISOSORBIDE MONONITRATE ER 30 MG PO TB24
30.0000 mg | ORAL_TABLET | Freq: Every day | ORAL | Status: DC
  Administered 2016-02-29 – 2016-03-01 (×2): 30 mg via ORAL
  Filled 2016-02-29 (×2): qty 1

## 2016-02-29 MED ORDER — ACETAMINOPHEN 325 MG PO TABS: 650.0000 mg | ORAL_TABLET | Freq: Four times a day (QID) | ORAL | Status: DC | PRN

## 2016-02-29 MED ORDER — ONDANSETRON HCL 4 MG/2ML IJ SOLN: 4.0000 mg | Freq: Four times a day (QID) | INTRAMUSCULAR | Status: DC | PRN

## 2016-02-29 MED ORDER — ASPIRIN EC 81 MG PO TBEC
81.0000 mg | DELAYED_RELEASE_TABLET | Freq: Every day | ORAL | Status: DC
  Administered 2016-02-29 – 2016-03-01 (×2): 81 mg via ORAL
  Filled 2016-02-29 (×2): qty 1

## 2016-02-29 MED ORDER — ONDANSETRON HCL 4 MG PO TABS: 4.0000 mg | ORAL_TABLET | Freq: Four times a day (QID) | ORAL | Status: DC | PRN

## 2016-02-29 MED ORDER — MEGESTROL ACETATE 40 MG PO TABS
40.0000 mg | ORAL_TABLET | Freq: Every day | ORAL | Status: DC
  Administered 2016-02-29 – 2016-03-01 (×2): 40 mg via ORAL
  Filled 2016-02-29 (×3): qty 1

## 2016-02-29 MED ORDER — PANTOPRAZOLE SODIUM 40 MG PO TBEC
40.0000 mg | DELAYED_RELEASE_TABLET | Freq: Every day | ORAL | Status: DC
  Administered 2016-02-29 – 2016-03-01 (×2): 40 mg via ORAL
  Filled 2016-02-29 (×2): qty 1

## 2016-02-29 MED ORDER — ENOXAPARIN SODIUM 40 MG/0.4ML ~~LOC~~ SOLN: 40.0000 mg | SUBCUTANEOUS | Status: DC

## 2016-02-29 MED ORDER — TRAZODONE HCL 50 MG PO TABS
50.0000 mg | ORAL_TABLET | Freq: Every day | ORAL | Status: DC
  Administered 2016-02-29: 21:00:00 50 mg via ORAL
  Filled 2016-02-29: qty 1

## 2016-02-29 MED ORDER — ACETAMINOPHEN 650 MG RE SUPP: 650.0000 mg | Freq: Four times a day (QID) | RECTAL | Status: DC | PRN

## 2016-02-29 MED ORDER — ONDANSETRON HCL 4 MG/2ML IJ SOLN
INTRAMUSCULAR | Status: AC
  Administered 2016-02-29: 4 mg via INTRAVENOUS
  Filled 2016-02-29: qty 2

## 2016-02-29 MED ORDER — SODIUM CHLORIDE 0.9% FLUSH
3.0000 mL | Freq: Two times a day (BID) | INTRAVENOUS | Status: DC
  Administered 2016-02-29: 3 mL via INTRAVENOUS

## 2016-02-29 MED ORDER — INSULIN ASPART 100 UNIT/ML ~~LOC~~ SOLN
0.0000 [IU] | Freq: Three times a day (TID) | SUBCUTANEOUS | Status: DC
  Administered 2016-02-29 – 2016-03-01 (×5): 3 [IU] via SUBCUTANEOUS
  Filled 2016-02-29 (×5): qty 3

## 2016-02-29 MED ORDER — IOPAMIDOL (ISOVUE-300) INJECTION 61%
15.0000 mL | INTRAVENOUS | Status: AC
  Administered 2016-02-29 (×2): 15 mL via ORAL

## 2016-02-29 MED ORDER — POTASSIUM CHLORIDE IN NACL 20-0.9 MEQ/L-% IV SOLN
INTRAVENOUS | Status: DC
  Administered 2016-02-29: 08:00:00 via INTRAVENOUS
  Filled 2016-02-29 (×2): qty 1000

## 2016-02-29 MED ORDER — MOMETASONE FURO-FORMOTEROL FUM 200-5 MCG/ACT IN AERO
2.0000 | INHALATION_SPRAY | Freq: Two times a day (BID) | RESPIRATORY_TRACT | Status: DC
  Administered 2016-02-29 – 2016-03-01 (×3): 2 via RESPIRATORY_TRACT
  Filled 2016-02-29: qty 8.8

## 2016-02-29 MED ORDER — PANCRELIPASE (LIP-PROT-AMYL) 12000-38000 UNITS PO CPEP
24000.0000 [IU] | ORAL_CAPSULE | Freq: Two times a day (BID) | ORAL | Status: DC
Start: 2016-02-29 — End: 2016-03-01
  Administered 2016-02-29 – 2016-03-01 (×2): 24000 [IU] via ORAL
  Filled 2016-02-29 (×2): qty 2

## 2016-02-29 MED ORDER — VITAMIN D 1000 UNITS PO TABS
1000.0000 [IU] | ORAL_TABLET | Freq: Every day | ORAL | Status: DC
Start: 2016-02-29 — End: 2016-03-01
  Administered 2016-02-29 – 2016-03-01 (×2): 1000 [IU] via ORAL
  Filled 2016-02-29 (×2): qty 1

## 2016-02-29 MED ORDER — SODIUM CHLORIDE 0.9 % IV SOLN
INTRAVENOUS | Status: DC
  Administered 2016-02-29: 22:00:00 via INTRAVENOUS
  Filled 2016-02-29 (×5): qty 1000

## 2016-02-29 MED ORDER — SENNOSIDES-DOCUSATE SODIUM 8.6-50 MG PO TABS: 1.0000 | ORAL_TABLET | Freq: Every evening | ORAL | Status: DC | PRN

## 2016-02-29 MED ORDER — INSULIN ASPART 100 UNIT/ML ~~LOC~~ SOLN: 0.0000 [IU] | Freq: Every day | SUBCUTANEOUS | Status: DC

## 2016-02-29 MED ORDER — LINAGLIPTIN 5 MG PO TABS
5.0000 mg | ORAL_TABLET | Freq: Every day | ORAL | Status: DC
  Administered 2016-02-29 – 2016-03-01 (×2): 5 mg via ORAL
  Filled 2016-02-29 (×2): qty 1

## 2016-02-29 MED ORDER — ATORVASTATIN CALCIUM 20 MG PO TABS
20.0000 mg | ORAL_TABLET | Freq: Every day | ORAL | Status: DC
  Administered 2016-02-29 – 2016-03-01 (×2): 20 mg via ORAL
  Filled 2016-02-29 (×2): qty 1

## 2016-02-29 MED ORDER — METOPROLOL SUCCINATE ER 25 MG PO TB24
25.0000 mg | ORAL_TABLET | Freq: Every day | ORAL | Status: DC
  Administered 2016-02-29 – 2016-03-01 (×2): 25 mg via ORAL
  Filled 2016-02-29 (×2): qty 1

## 2016-02-29 MED ORDER — ONDANSETRON HCL 4 MG/2ML IJ SOLN
4.0000 mg | Freq: Once | INTRAMUSCULAR | Status: AC
  Administered 2016-02-29: 4 mg via INTRAVENOUS

## 2016-02-29 MED ORDER — TRAMADOL HCL 50 MG PO TABS
50.0000 mg | ORAL_TABLET | Freq: Four times a day (QID) | ORAL | Status: DC | PRN
  Administered 2016-02-29: 50 mg via ORAL
  Filled 2016-02-29: qty 1

## 2016-02-29 MED ORDER — POTASSIUM CHLORIDE 20 MEQ PO PACK
40.0000 meq | PACK | Freq: Two times a day (BID) | ORAL | Status: DC
  Administered 2016-02-29 – 2016-03-01 (×4): 40 meq via ORAL
  Filled 2016-02-29 (×4): qty 2

## 2016-02-29 NOTE — Care Management (Signed)
Admitted to this facility with the diagnosis of pancreatic cancer. Lisa Beard (brothers) live in the home. Daughter is [name] adviser [phone]). Last seen Dr. Ellison Hughs 12/23/15. Next appointment is in March. [facility] last May. No skilled facility. No home oxygen. Rolling walker and cane in the home. Prescriptions are filled at [facility], No Life Alert. Takes care of all basic activities of daily living herself, doesn't drive. Daughter helps with errands. Last fall was 6 months ago. Decreased appetite. Lost 30 pounds in the last few months. Family will transport. Shelbie Ammons RN MSN CCM Care Management

## 2016-02-29 NOTE — Progress Notes (Signed)
Inpatient Diabetes Program Recommendations  AACE/ADA: New Consensus Statement on Inpatient Glycemic Control (2015)  Target Ranges:  Prepandial:   less than 140 mg/dL      Peak postprandial:   less than 180 mg/dL (1-2 hours)      Critically ill patients:  140 - 180 mg/dL   Lab Results  Component Value Date   GLUCAP 174 (H) 02/29/2016    Review of Glycemic Control  Results for KHAILAH, BOYLE (MRN [id number]) as of 02/29/2016 09:33  Ref. Range 02/29/2016 07:47  Glucose-Capillary Latest Ref Range: 65 - 99 mg/dL 174 (H)    Diabetes history: Type 2 Outpatient Diabetes medications: Januvia 5mg /day Current orders for Inpatient glycemic control: Tradgenta 5 mg per day, Novolog sensitive scale 0-9 units tid, Novolog 0-5 units qhs  Inpatient Diabetes Program Recommendations:    Per ADA recommendations "consider performing an A1C on all patients with diabetes or hyperglycemia admitted to the hospital if not performed in the prior 3 months".  Agree with current medications for blood sugar management.   Gentry Fitz, RN, BA, MHA, CDE Diabetes Coordinator Inpatient Glycemic Control Team [phone] (Team Pager) [phone] ([place]) 02/29/2016 9:34 AM

## 2016-02-29 NOTE — Consult Note (Signed)
Consultation Note Date: 02/29/2016   Patient Name: Lisa Beard  DOB: 08-Mar-1926  MRN: [id number]  Age / Sex: 83 y.o., female  PCP: Lisa Hartigan, MD Referring Physician: Vaughan Basta, MD  Reason for Consultation: Establishing goals of care  HPI/Patient Profile: 83 y.o. female  with past medical history of hypertension, DM, CAD, COPD, CKD, arthritis, GERD, and pancreatic mass admitted on 02/28/2016 with weakness and abdominal discomfort. Patient with past diagnosis of pancreatic mass highly suggestive of IPMN-followed by Dr. Tiffany Beard GI. Also evaluated by [facility], last seen in March 2017. She is not a candidate for surgical interventions and has declined further evaluation of mass including biopsy/chemo/radiation. Family requests palliative consultation for goals of care/hospice evaluation.        Clinical Assessment and Goals of Care: I have reviewed medical records, discussed with Dr. Anselm Beard, and met with patient and multiple family members at bedside to discuss diagnosis, Lisa Beard, EOL wishes, disposition and options.  Introduced Palliative Medicine as specialized medical care for people living with serious illness. It focuses on providing relief from the symptoms and stress of a serious illness. The goal is to improve quality of life for both the patient and the family.  Patient lives at home with two sons. She has a very supportive family that is available to assist with her needs. Baseline, she is alert, oriented, ambulates independently, and performs ADL's with minimal assist. She intermittently has a poor appetite. She has lost 6 lbs since December.   I attempted to elicit values and goals of care important to the patient. She has remained fairly independent her entire life. She speaks of "days with depression" which stems from her not "getting around" like she once was. Taking Zoloft. Her  family and ability to stay at home are priorities.    Patient and family understand current hospitalization and likelihood of worsening malignancy. The patient is firm in her decision of not wanting to pursue biopsy or chemotherapy. Also that she would not want to be resuscitated, on life support, or with a feeding tube. Family confirms understanding of her EOL wishes and daughter states "we respect her decision." Educated and completed MOST form with patient and family.  Knowing her wishes, we discussed focus on comfort, quality, and dignity for as much time as she may have left. Discussed focus on symptom management.   Hospice and Palliative Care services outpatient were explained and offered. Family agreeable for palliative services to follow at home. Daughter would like for Ms. Lisa Beard to continue appointments with PCP and GI as needed. Discussed transition from palliative to hospice services when appropriate.   Answered questions and concerns. Provided emotional support.    SUMMARY OF RECOMMENDATIONS    DNR/DNI  Completed MOST form and placed in chart.   Palliative services to follow on discharge. Patient with good functional/nutritional status. ? Prognosis. Can transition to home hospice services when eligible.   PMT will continue to shadow chart and support family during hospitalization.   Code Status/Advance Care Planning:  DNR   Symptom Management:   Tramadol 46m PO q6h prn pain (Recently prescribed this at home per PCP)  Palliative Prophylaxis:   Aspiration, Delirium Protocol, Frequent Pain Assessment and Oral Care  Psycho-social/Spiritual:   Desire for further Chaplaincy support:no  Additional Recommendations: Caregiving  Support/Resources and Education on Hospice  Prognosis:   Unable to determine  Discharge Planning: Home with Palliative Services      Primary Diagnoses: Present on Admission: . Hypokalemia   I have reviewed the medical record,  interviewed the patient and family, and examined the patient. The following aspects are pertinent.  Past Medical History:  Diagnosis Date  . Anemia   . Arthritis   . Asthma   . Chronic kidney disease   . [place]   . Coronary artery disease   . Diabetes mellitus without complication [place]   . GERD (gastroesophageal reflux disease)   . Hypertension   . Renal insufficiency    Social History   Social History  . Marital status: Widowed    Spouse name: N/A  . Number of children: N/A  . Years of education: N/A   Occupational History  . retired    Social History Main Topics  . Smoking status: Never Smoker  . Smokeless tobacco: Never Used  . Alcohol use No  . Drug use: No  . Sexual activity: No   Other Topics Concern  . None   Social History Narrative  . None   Family History  Problem Relation Age of Onset  . CAD Mother   . Cancer Sister   . Hypertension Sister    Scheduled Meds: . aspirin EC  81 mg Oral Daily  . atorvastatin  20 mg Oral Daily  . cholecalciferol  1,000 Units Oral Daily  . enoxaparin (LOVENOX) injection  30 mg Subcutaneous Q24H  . insulin aspart  0-15 Units Subcutaneous TID WC  . insulin aspart  0-5 Units Subcutaneous QHS  . isosorbide mononitrate  30 mg Oral Daily  . linagliptin  5 mg Oral Daily  . lipase/protease/amylase  24,000 Units Oral BID WC  . megestrol  40 mg Oral Daily  . metoprolol succinate  25 mg Oral Daily  . mometasone-formoterol  2 puff Inhalation BID  . pantoprazole  40 mg Oral Daily  . potassium chloride  40 mEq Oral BID  . sodium chloride flush  3 mL Intravenous Q12H  . traZODone  50 mg Oral QHS   Continuous Infusions: . 0.9% NaCl with KCL 20 mEq/L infusion     PRN Meds:.acetaminophen **OR** acetaminophen, ondansetron **OR** ondansetron (ZOFRAN) IV, senna-docusate, traMADol Medications Prior to Admission:  Prior to Admission medications   Medication Sig Start Date End Date Taking?  Authorizing Provider  acetaminophen (TYLENOL) 325 MG tablet Take 325 mg by mouth every 6 (six) hours as needed for mild pain, moderate pain or fever.   Yes Historical Provider, MD  atorvastatin (LIPITOR) 20 MG tablet Take 1 tablet (20 mg total) by mouth daily. 10/27/14  Yes RTheodoro Grist MD  Cholecalciferol (VITAMIN D3) 1000 UNITS CAPS Take 1,000 Units by mouth daily.   Yes Historical Provider, MD  diphenoxylate-atropine (LOMOTIL) 2.5-0.025 MG per tablet Take 2 tablets by mouth See admin instructions. Take 2 tablets orally once a day in the morning. *Can take up to 8 tablets daily*   Yes Historical Provider, MD  Fluticasone-Salmeterol (ADVAIR) 250-50 MCG/DOSE AEPB Inhale 1 puff into the lungs 2 (two) times daily.   Yes Historical Provider, MD  isosorbide mononitrate (IMDUR) 30 MG 24 hr tablet Take 1 tablet (30 mg total) by mouth daily. 10/27/14  Yes Theodoro Grist, MD  metoprolol succinate (TOPROL-XL) 25 MG 24 hr tablet Take 25 mg by mouth daily.   Yes Historical Provider, MD  Pancrelipase, Lip-Prot-Amyl, (CREON) 24000-76000 units CPEP Take 1 capsule by mouth 2 (two) times daily. 01/31/16  Yes Historical Provider, MD  pantoprazole (PROTONIX) 40 MG tablet Take 40 mg by mouth daily.   Yes Historical Provider, MD  sitaGLIPtin (JANUVIA) 50 MG tablet Take 50 mg by mouth daily.   Yes Historical Provider, MD  spironolactone (ALDACTONE) 25 MG tablet Take 25 mg by mouth daily.   Yes Historical Provider, MD  torsemide (DEMADEX) 10 MG tablet Take 10 mg by mouth 2 (two) times daily.    Yes Historical Provider, MD  traZODone (DESYREL) 50 MG tablet Take 50 mg by mouth at bedtime.   Yes Historical Provider, MD  aspirin EC 81 MG tablet Take 1 tablet (81 mg total) by mouth daily. Patient not taking: Reported on 02/29/2016 10/27/14   Theodoro Grist, MD   Allergies  Allergen Reactions  . Biaxin [Clarithromycin]   . Boniva [Ibandronic Acid] Other (See Comments)    Reaction:  GI upset   Review of Systems    Constitutional: Positive for appetite change and fatigue.  Gastrointestinal: Positive for abdominal pain and diarrhea.   Physical Exam  Constitutional: She is oriented to person, place, and time. She is cooperative.  HENT:  Head: Normocephalic and atraumatic.  Cardiovascular: Regular rhythm.   Pulmonary/Chest: Effort normal and breath sounds normal.  Abdominal: Bowel sounds are normal. She exhibits distension. There is tenderness.  RN to give prn tramadol  Neurological: She is alert and oriented to person, place, and time.  Skin: Skin is warm and dry. There is pallor.  Psychiatric: She has a normal mood and affect. Her speech is normal and behavior is normal. Cognition and memory are normal.  Nursing note and vitals reviewed.  Vital Signs: BP 111/68 (BP Location: Left Arm)   Pulse 85   Temp 98.7 F (37.1 C) (Oral)   Resp 16   Ht 5' 2"  (1.575 m)   Wt 70 kg (154 lb 6.4 oz)   SpO2 96%   BMI 28.24 kg/m  Pain Assessment: No/denies pain   Pain Score: 0-No pain  SpO2: SpO2: 96 % O2 Device:SpO2: 96 % O2 Flow Rate: .   IO: Intake/output summary:   Intake/Output Summary (Last 24 hours) at 02/29/16 1413 Last data filed at 02/29/16 0800  Gross per 24 hour  Intake              240 ml  Output                0 ml  Net              240 ml    LBM: Last BM Date: 02/29/16 Baseline Weight: Weight: 72.6 kg (160 lb) Most recent weight: Weight: 70 kg (154 lb 6.4 oz)     Palliative Assessment/Data: PPS 50%   Flowsheet Rows   Flowsheet Row Most Recent Value  Intake Tab  Referral Department  Hospitalist  Unit at Time of Referral  Oncology Unit  Palliative Care Primary Diagnosis  Cancer  Date Notified  02/29/16  Palliative Care Type  New Palliative care  Reason for referral  Clarify Goals of Care  Date of Admission  02/28/16  Date first seen by Palliative Care  02/29/16  #  of days IP prior to Palliative referral  1  Clinical Assessment  Palliative Performance Scale Score  50%   Psychosocial & Spiritual Assessment  Palliative Care Outcomes  Patient/Family meeting held?  Yes  Who was at the meeting?  patient with multiple family members  Palliative Care Outcomes  Clarified goals of care, Provided psychosocial or spiritual support, Counseled regarding hospice, Improved pain interventions, Provided advance care planning, Linked to palliative care logitudinal support     Time In: 0634 Time Out: 1400 Time Total: 45mn Greater than 50%  of this time was spent counseling and coordinating care related to the above assessment and plan.  Signed by:  MIhor Dow FNP-C Palliative Medicine Team  Phone: [phone]: [phone]  Please contact Palliative Medicine Team phone at [phone] questions and concerns.  For individual provider: See AShea Evans

## 2016-02-29 NOTE — Progress Notes (Addendum)
New referral for Home Palliative services  following discharge received from [facility]. Referral made aware. Thank you. Flo Shanks RN, BSN, [facility] [place], hospital liaison [phone] c

## 2016-02-29 NOTE — Progress Notes (Addendum)
Family Meeting Note  Advance Directive:yes  Today a meeting took place with the Patient and son, daughter, son in law, grand daughter.  The following clinical team members were present during this meeting:MD  The following were discussed:Patient's diagnosis: pancreatic cancer , Patient's progosis: Unable to determine and Goals for treatment: DNR  Discussed with pt and family about treatment options like chemo or radiation- to be decided further by oncologist at [facility]. They does not want that and agreed on palliative option.  Additional follow-up to be provided: palliative care consult.  Time spent during discussion:20 minutes  Swan Zayed, Rosalio Macadamia, MD

## 2016-02-29 NOTE — Progress Notes (Signed)
[facility] NAME: Lisa Beard    MR#:  [id number]  DATE OF BIRTH:  12/31/1926  SUBJECTIVE:  CHIEF COMPLAINT:   Chief Complaint  Patient presents with  . Cough  . Weakness   Diagnosed with intraductal mucosal malignancy of pancrease, seen by GI Dr. Tiffany Kocher and Oncology at [facility]. Came with generalized weakness, found to have Low K. Pt and family agreed to have palliative care.  REVIEW OF SYSTEMS:  CONSTITUTIONAL: No fever, positive for fatigue or weakness.  EYES: No blurred or double vision.  EARS, NOSE, AND THROAT: No tinnitus or ear pain.  RESPIRATORY: No cough, shortness of breath, wheezing or hemoptysis.  CARDIOVASCULAR: No chest pain, orthopnea, edema.  GASTROINTESTINAL: No nausea, vomiting, diarrhea or abdominal pain.  GENITOURINARY: No dysuria, hematuria.  ENDOCRINE: No polyuria, nocturia,  HEMATOLOGY: No anemia, easy bruising or bleeding SKIN: No rash or lesion. MUSCULOSKELETAL: No joint pain or arthritis.   NEUROLOGIC: No tingling, numbness, weakness.  PSYCHIATRY: No anxiety or depression.   ROS  DRUG ALLERGIES:   Allergies  Allergen Reactions  . Biaxin [Clarithromycin]   . Boniva [Ibandronic Acid] Other (See Comments)    Reaction:  GI upset    VITALS:  Blood pressure 111/68, pulse 85, temperature 98.7 F (37.1 C), temperature source Oral, resp. rate 16, height 5\' 2"  (1.575 m), weight 70 kg (154 lb 6.4 oz), SpO2 96 %.  PHYSICAL EXAMINATION:  GENERAL:  83 y.o.-year-old patient lying in the bed with no acute distress.  EYES: Pupils equal, round, reactive to light and accommodation. No scleral icterus. Extraocular muscles intact.  HEENT: Head atraumatic, normocephalic. Oropharynx and nasopharynx clear.  NECK:  Supple, no jugular venous distention. No thyroid enlargement, no tenderness.  LUNGS: Normal breath sounds bilaterally, no wheezing, rales,rhonchi or crepitation. No use of accessory muscles of respiration.   CARDIOVASCULAR: S1, S2 normal. No murmurs, rubs, or gallops.  ABDOMEN: Soft, nontender, nondistended. Bowel sounds present. No organomegaly or mass.  EXTREMITIES: No pedal edema, cyanosis, or clubbing.  NEUROLOGIC: Cranial nerves II through XII are intact. Muscle strength 3-4 /5 in all extremities. Sensation intact. Gait not checked.  PSYCHIATRIC: The patient is alert and oriented x 3.  SKIN: No obvious rash, lesion, or ulcer.   Physical Exam LABORATORY PANEL:   CBC  Recent Labs Lab 02/29/16 1015  WBC 7.3  HGB 10.0*  HCT 29.1*  PLT 255   ------------------------------------------------------------------------------------------------------------------  Chemistries   Recent Labs Lab 02/28/16 2336 02/29/16 1015 02/29/16 1304  NA 133* 135  --   K 2.8* 3.9 4.2  CL 100* 106  --   CO2 23 24  --   GLUCOSE 341* 242*  --   BUN 13 11  --   CREATININE 1.53* 1.35*  --   CALCIUM 8.1* 7.3*  --   AST 19  --   --   ALT 12*  --   --   ALKPHOS 121  --   --   BILITOT 0.5  --   --    ------------------------------------------------------------------------------------------------------------------  Cardiac Enzymes No results for input(s): TROPONINI in the last 168 hours. ------------------------------------------------------------------------------------------------------------------  RADIOLOGY:  Ct Abdomen Pelvis Wo Contrast  Result Date: 02/29/2016 CLINICAL DATA:  Abdominal pain and diarrhea EXAM: CT ABDOMEN AND PELVIS WITHOUT CONTRAST TECHNIQUE: Multidetector CT imaging of the abdomen and pelvis was performed following the standard protocol without IV contrast. COMPARISON:  CT abdomen pelvis 10/21/2015 FINDINGS: Lower chest: No pulmonary nodules. No visible pleural or pericardial  effusion. Hepatobiliary: Normal hepatic size and contours. No perihepatic ascites. No intra- or extrahepatic biliary dilatation. Status post cholecystectomy Pancreas: Multilobulated, predominantly cystic  mass of the pancreatic head measures 5.7 x 5.0 cm, slightly increased in size. The pancreatic duct remains dilated, unchanged. No peripancreatic fluid collection. Spleen: Normal. Adrenals/Urinary Tract: Normal adrenal glands. No hydronephrosis or nephrolithiasis. No abnormal perinephric stranding. No ureteral obstruction. Stomach/Bowel: No abnormal bowel dilatation. No bowel wall thickening or adjacent fat stranding to indicate acute inflammation. No abdominal fluid collection. There is a focus of fat attenuation within the proximal descending colon, not present on the prior examination. The appendix is not seen. Vascular/Lymphatic: There is atherosclerotic calcification of the non aneurysmal abdominal aorta. No abdominal or pelvic lymphadenopathy. Reproductive: Status post hysterectomy. No adnexal mass. No free fluid in the pelvis. Musculoskeletal: No lytic or blastic osseous lesion. Normal visualized extrathoracic and extraperitoneal soft tissues. Other: No contributory non-categorized findings. IMPRESSION: 1. No acute abnormality of the abdomen or pelvis. 2. Slightly increased size predominantly cystic pancreatic head lesion. This remains most consistent with main duct intraductal papillary mucinous neoplasm. Associated dilatation of the pancreatic duct is unchanged. 3. Aortic atherosclerosis. Electronically Signed   By: Ulyses Jarred M.D.   On: 02/29/2016 03:19   Dg Chest Portable 1 View  Result Date: 02/29/2016 CLINICAL DATA:  Acute onset of generalized weakness. Diarrhea and hyperglycemia. Confusion. Initial encounter. EXAM: PORTABLE CHEST 1 VIEW COMPARISON:  Chest radiograph performed 10/20/2015, and CT of the chest performed 10/21/2015 FINDINGS: The lungs are well-aerated. Mild peribronchial thickening is noted. There is no evidence of focal opacification, pleural effusion or pneumothorax. The cardiomediastinal silhouette is within normal limits. No acute osseous abnormalities are seen. IMPRESSION: Mild  peribronchial thickening noted.  Lungs otherwise grossly clear. Electronically Signed   By: Garald Balding M.D.   On: 02/29/2016 00:21    ASSESSMENT AND PLAN:   Active Problems:   Hypokalemia  * Hypokalemia   Hyponatremia      Secondary to recurrent diarrhea, continue IV fluid and recheck.    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 Vira Agar.   She and her family do not wish to consider chemotherapy or radiation options and would like to meet palliative care and consider hospice.   I had detailed discussion about this and cold. Consult to further facilitate arrangements.  * Coronary artery disease  Continue aspirin, statin, Imdur.  * COPD   Continue inhalers, no active symptoms.  * Chronic kidney disease stage III, stable continue monitoring.    All the records are reviewed and case discussed with Care Management/Social Workerr. Management plans discussed with the patient, family and they are in agreement.  CODE STATUS: DO NOT RESUSCITATE  TOTAL TIME TAKING CARE OF THIS PATIENT: 50 minutes.   Discussed with the patient, her daughter and son and daughter-in-law in the room.  POSSIBLE D/C IN 1-2 DAYS, DEPENDING ON CLINICAL CONDITION.   Vaughan Basta M.D on 02/29/2016   Between 7am to 6pm - Pager - [phone]  After 6pm go to [email or web address] - password [email or web address] [facility]  Office  [phone]  CC: Primary care physician; [facility], Chrissie Noa, MD  Note: This dictation was prepared with Dragon dictation along with smaller phrase technology. Any transcriptional errors that result from this process are unintentional.

## 2016-02-29 NOTE — ED Notes (Signed)
Admitting Provider at bedside. 

## 2016-02-29 NOTE — Progress Notes (Signed)
Lovenox changed to 30 mg daily for CrCl<30. 

## 2016-02-29 NOTE — H&P (Signed)
[facility] [facility] [place] NAME: Lisa Beard    MR#:  [id number]  DATE OF BIRTH:  [birth year]  DATE OF ADMISSION:  02/28/2016  PRIMARY CARE PHYSICIAN: FELDPAUSCH, Chrissie Noa, MD   REQUESTING/REFERRING PHYSICIAN:   CHIEF COMPLAINT:   Chief Complaint  Patient presents with  . Cough  . Weakness    HISTORY OF PRESENT ILLNESS: Lisa Beard  is a 83 y.o. female with a known history of Pancreatic mass, chronic kidney disease, COPD, coronary artery disease, diabetes mellitus type 2, GERD, hypertension presented to the emergency room with the abdominal discomfort and nausea. Patient has not been eating well for the last few days. She has poor appetite and has nausea. Patient complains of abdominal discomfort which is generalized and mostly in the epigastric area. It's aching in nature 3 out of 10 on scale of 1-10. Patient has this pancreatic mass for which she had a biopsy [facility] according to the family members but did not know exactly whether there is any malignancy. Has generalized weakness. She was evaluated in the emergency room potassium level was low around 2.8. Patient appears dry and dehydrated. Patient was worked up with CT abdomen and chest x-ray in the emergency room. Hospitalist service was consulted for further care of the patient.  PAST MEDICAL HISTORY:   Past Medical History:  Diagnosis Date  . Anemia   . Arthritis   . Asthma   . Chronic kidney disease   . COPD (chronic obstructive pulmonary disease) ([place])   . Coronary artery disease   . Diabetes mellitus without complication ([place])   . GERD (gastroesophageal reflux disease)   . Hypertension   . Renal insufficiency     PAST SURGICAL HISTORY: Past Surgical History:  Procedure Laterality Date  . ABDOMINAL HYSTERECTOMY    . CHOLECYSTECTOMY    . EYE SURGERY      SOCIAL HISTORY:  Social History  Substance Use Topics  . Smoking status: Never Smoker  . Smokeless tobacco: Never  Used  . Alcohol use No    FAMILY HISTORY:  Family History  Problem Relation Age of Onset  . CAD Mother   . Cancer Sister   . Hypertension Sister     DRUG ALLERGIES:  Allergies  Allergen Reactions  . Biaxin [Clarithromycin]   . Boniva [Ibandronic Acid] Other (See Comments)    Reaction:  GI upset    REVIEW OF SYSTEMS:   CONSTITUTIONAL: No fever, Has weakness.  EYES: No blurred or double vision.  EARS, NOSE, AND THROAT: No tinnitus or ear pain.  RESPIRATORY: No cough, shortness of breath, wheezing or hemoptysis.  CARDIOVASCULAR: No chest pain, orthopnea, edema.  GASTROINTESTINAL: Has nausea, vomiting,  abdominal pain.  Has increased frequency of bowel movements GENITOURINARY: No dysuria, hematuria.  ENDOCRINE: No polyuria, nocturia,  HEMATOLOGY: No anemia, easy bruising or bleeding SKIN: No rash or lesion. MUSCULOSKELETAL: No joint pain or arthritis.   NEUROLOGIC: No tingling, numbness, weakness.  PSYCHIATRY: No anxiety or depression.   MEDICATIONS AT HOME:  Prior to Admission medications   Medication Sig Start Date End Date Taking? Authorizing Provider  acetaminophen (TYLENOL) 325 MG tablet Take 325 mg by mouth every 6 (six) hours as needed for mild pain, moderate pain or fever.   Yes Historical Provider, MD  atorvastatin (LIPITOR) 20 MG tablet Take 1 tablet (20 mg total) by mouth daily. 10/27/14  Yes Theodoro Grist, MD  Cholecalciferol (VITAMIN D3) 1000 UNITS CAPS Take 1,000 Units by  mouth daily.   Yes Historical Provider, MD  diphenoxylate-atropine (LOMOTIL) 2.5-0.025 MG per tablet Take 2 tablets by mouth See admin instructions. Take 2 tablets orally once a day in the morning. *Can take up to 8 tablets daily*   Yes Historical Provider, MD  Fluticasone-Salmeterol (ADVAIR) 250-50 MCG/DOSE AEPB Inhale 1 puff into the lungs 2 (two) times daily.   Yes Historical Provider, MD  isosorbide mononitrate (IMDUR) 30 MG 24 hr tablet Take 1 tablet (30 mg total) by mouth daily. 10/27/14   Yes Theodoro Grist, MD  metoprolol succinate (TOPROL-XL) 25 MG 24 hr tablet Take 25 mg by mouth daily.   Yes Historical Provider, MD  Pancrelipase, Lip-Prot-Amyl, (CREON) 24000-76000 units CPEP Take 1 capsule by mouth 2 (two) times daily. 01/31/16  Yes Historical Provider, MD  pantoprazole (PROTONIX) 40 MG tablet Take 40 mg by mouth daily.   Yes Historical Provider, MD  sitaGLIPtin (JANUVIA) 50 MG tablet Take 50 mg by mouth daily.   Yes Historical Provider, MD  spironolactone (ALDACTONE) 25 MG tablet Take 25 mg by mouth daily.   Yes Historical Provider, MD  torsemide (DEMADEX) 10 MG tablet Take 10 mg by mouth 2 (two) times daily.    Yes Historical Provider, MD  traZODone (DESYREL) 50 MG tablet Take 50 mg by mouth at bedtime.   Yes Historical Provider, MD  aspirin EC 81 MG tablet Take 1 tablet (81 mg total) by mouth daily. Patient not taking: Reported on 02/29/2016 10/27/14   Theodoro Grist, MD      PHYSICAL EXAMINATION:   VITAL SIGNS: Blood pressure 130/81, pulse 71, temperature 98.2 F (36.8 C), temperature source Oral, resp. rate (!) 21, weight 72.6 kg (160 lb), SpO2 94 %.  GENERAL:  83 y.o.-year-old patient lying in the bed with no acute distress.  EYES: Pupils equal, round, reactive to light and accommodation. No scleral icterus. Extraocular muscles intact.  HEENT: Head atraumatic, normocephalic. Oropharynx dry and nasopharynx clear.  NECK:  Supple, no jugular venous distention. No thyroid enlargement, no tenderness.  LUNGS: Normal breath sounds bilaterally, no wheezing, rales,rhonchi or crepitation. No use of accessory muscles of respiration.  CARDIOVASCULAR: S1, S2 normal. No murmurs, rubs, or gallops.  ABDOMEN: Soft, mild tenderness epigastrium, nondistended. Bowel sounds present. No organomegaly or mass.  EXTREMITIES: No pedal edema, cyanosis, or clubbing.  NEUROLOGIC: Cranial nerves II through XII are intact. Muscle strength 5/5 in all extremities. Sensation intact. Gait not checked.   PSYCHIATRIC: The patient is alert and oriented x 3.  SKIN: No obvious rash, lesion, or ulcer.   LABORATORY PANEL:   CBC  Recent Labs Lab 02/28/16 2336  WBC 8.7  HGB 11.3*  HCT 34.1*  PLT 263  MCV 92.0  MCH 30.4  MCHC 33.0  RDW 14.1  LYMPHSABS 1.2  MONOABS 0.7  EOSABS 0.1  BASOSABS 0.0   ------------------------------------------------------------------------------------------------------------------  Chemistries   Recent Labs Lab 02/28/16 2336  NA 133*  K 2.8*  CL 100*  CO2 23  GLUCOSE 341*  BUN 13  CREATININE 1.53*  CALCIUM 8.1*  AST 19  ALT 12*  ALKPHOS 121  BILITOT 0.5   ------------------------------------------------------------------------------------------------------------------ estimated creatinine clearance is 24.2 mL/min (by C-G formula based on SCr of 1.53 mg/dL (H)). ------------------------------------------------------------------------------------------------------------------ No results for input(s): TSH, T4TOTAL, T3FREE, THYROIDAB in the last 72 hours.  Invalid input(s): FREET3   Coagulation profile No results for input(s): INR, PROTIME in the last 168 hours. ------------------------------------------------------------------------------------------------------------------- No results for input(s): DDIMER in the last 72 hours. -------------------------------------------------------------------------------------------------------------------  Cardiac Enzymes  No results for input(s): CKMB, TROPONINI, MYOGLOBIN in the last 168 hours.  Invalid input(s): CK ------------------------------------------------------------------------------------------------------------------ Invalid input(s): POCBNP  ---------------------------------------------------------------------------------------------------------------  Urinalysis    Component Value Date/Time   COLORURINE COLORLESS (A) 02/28/2016 2336   APPEARANCEUR CLEAR (A) 02/28/2016 2336    APPEARANCEUR Clear 12/12/2013 1545   LABSPEC 1.000 (L) 02/28/2016 2336   LABSPEC 1.006 12/12/2013 1545   PHURINE 6.0 02/28/2016 2336   GLUCOSEU >=500 (A) 02/28/2016 2336   GLUCOSEU Negative 12/12/2013 1545   HGBUR SMALL (A) 02/28/2016 2336   BILIRUBINUR NEGATIVE 02/28/2016 2336   BILIRUBINUR Negative 12/12/2013 1545   [place] 02/28/2016 2336   PROTEINUR NEGATIVE 02/28/2016 2336   NITRITE NEGATIVE 02/28/2016 2336   LEUKOCYTESUR NEGATIVE 02/28/2016 2336   LEUKOCYTESUR Negative 12/12/2013 1545     RADIOLOGY: Ct Abdomen Pelvis Wo Contrast  Result Date: 02/29/2016 CLINICAL DATA:  Abdominal pain and diarrhea EXAM: CT ABDOMEN AND PELVIS WITHOUT CONTRAST TECHNIQUE: Multidetector CT imaging of the abdomen and pelvis was performed following the standard protocol without IV contrast. COMPARISON:  CT abdomen pelvis 10/21/2015 FINDINGS: Lower chest: No pulmonary nodules. No visible pleural or pericardial effusion. Hepatobiliary: Normal hepatic size and contours. No perihepatic ascites. No intra- or extrahepatic biliary dilatation. Status post cholecystectomy Pancreas: Multilobulated, predominantly cystic mass of the pancreatic head measures 5.7 x 5.0 cm, slightly increased in size. The pancreatic duct remains dilated, unchanged. No peripancreatic fluid collection. Spleen: Normal. Adrenals/Urinary Tract: Normal adrenal glands. No hydronephrosis or nephrolithiasis. No abnormal perinephric stranding. No ureteral obstruction. Stomach/Bowel: No abnormal bowel dilatation. No bowel wall thickening or adjacent fat stranding to indicate acute inflammation. No abdominal fluid collection. There is a focus of fat attenuation within the proximal descending colon, not present on the prior examination. The appendix is not seen. Vascular/Lymphatic: There is atherosclerotic calcification of the non aneurysmal abdominal aorta. No abdominal or pelvic lymphadenopathy. Reproductive: Status post hysterectomy. No adnexal  mass. No free fluid in the pelvis. Musculoskeletal: No lytic or blastic osseous lesion. Normal visualized extrathoracic and extraperitoneal soft tissues. Other: No contributory non-categorized findings. IMPRESSION: 1. No acute abnormality of the abdomen or pelvis. 2. Slightly increased size predominantly cystic pancreatic head lesion. This remains most consistent with main duct intraductal papillary mucinous neoplasm. Associated dilatation of the pancreatic duct is unchanged. 3. Aortic atherosclerosis. Electronically Signed   By: Ulyses Jarred M.D.   On: 02/29/2016 03:19   Dg Chest Portable 1 View  Result Date: 02/29/2016 CLINICAL DATA:  Acute onset of generalized weakness. Diarrhea and hyperglycemia. Confusion. Initial encounter. EXAM: PORTABLE CHEST 1 VIEW COMPARISON:  Chest radiograph performed 10/20/2015, and CT of the chest performed 10/21/2015 FINDINGS: The lungs are well-aerated. Mild peribronchial thickening is noted. There is no evidence of focal opacification, pleural effusion or pneumothorax. The cardiomediastinal silhouette is within normal limits. No acute osseous abnormalities are seen. IMPRESSION: Mild peribronchial thickening noted.  Lungs otherwise grossly clear. Electronically Signed   By: Garald Balding M.D.   On: 02/29/2016 00:21    EKG: Orders placed or performed during the hospital encounter of 10/20/15  . ED EKG  . EKG 12-Lead  . EKG 12-Lead  . ED EKG  . ED EKG  . ED EKG    IMPRESSION AND PLAN: 83 year old elderly female patient with history of chronic kidney disease, COPD, coronary disease, type 2 diabetes mellitus, pancreatic mass presented to the emergency room with poor oral intake, nausea and generalized weakness. Admitting diagnosis 1. Hypokalemia 2. Hyponatremia 3. Dehydration 4. Adult failure to thrive 5. Pancreatic  mass which has increased in size 6. Type 2 diabetes mellitus Treatment plan Admit patient to medical floor IV fluid hydration Potassium  supplementation intravenously and orally. Follow-up electrolytes Appetite stimulant Obtain records from [facility] regarding the biopsy of the pancreatic mass Supportive care  All the records are reviewed and case discussed with ED provider. Management plans discussed with the patient, family and they are in agreement.  CODE STATUS:FULL CODE Surrogate Decision maker : Daughter [name]) Code Status History    Date Active Date Inactive Code Status Order ID Comments User Context   10/25/2014  7:05 PM 10/27/2014  6:55 PM Full Code [id number]  Idelle Crouch, MD Inpatient   05/25/2014  8:41 AM 05/26/2014  5:22 PM Full Code [id number]  Juluis Mire, MD Inpatient       TOTAL TIME TAKING CARE OF THIS PATIENT: 52 minutes.    Saundra Shelling M.D on 02/29/2016 at 5:08 AM  Between 7am to 6pm - Pager - [phone]  After 6pm go to [email or web address] - password [email or web address] [facility]  [place] [facility]  Office  [phone]  CC: Primary care physician; [facility], Chrissie Noa, MD

## 2016-02-29 NOTE — Progress Notes (Signed)
PT Cancellation Note  Patient Details Name: Lisa Beard MRN: [id number] DOB: December 21, 1926   Cancelled Treatment:    Reason Eval/Treat Not Completed: Medical issues which prohibited therapy (Consult received and cahrt reviewed.  Evaluation initiated; however, patient noted with new-onset of L LE swelling, warmth and erythema, tender to compression.  Primary RN informed/aware and dopper UE to rule out DVT ordered at this time.  Will hold full evaluation until test complete, results received and patient cleared for activity.)  Of note, patient lives with two sons (home alone during parts of the day) in mobile home with 6 steps (bilat rails, too far apart) to enter.  Indep with ADLs (tub bath); ambulatory for household distances without assist device, SPC for community distances.  Denies fall history.  Multiple family members present in room during session; appear eager for patient to return home.   Shelvy Heckert H. Owens Shark, PT, DPT, NCS 02/29/16, 2:47 PM [phone]

## 2016-03-01 LAB — GLUCOSE, CAPILLARY
Glucose-Capillary: 177 mg/dL — ABNORMAL HIGH (ref 65–99)
Glucose-Capillary: 188 mg/dL — ABNORMAL HIGH (ref 65–99)

## 2016-03-01 MED ORDER — POTASSIUM CHLORIDE 20 MEQ PO PACK: 20.0000 meq | PACK | Freq: Two times a day (BID) | ORAL | 0 refills | Status: AC

## 2016-03-01 MED ORDER — MEGESTROL ACETATE 40 MG PO TABS: 40.0000 mg | ORAL_TABLET | Freq: Every day | ORAL | 0 refills | Status: AC

## 2016-03-01 NOTE — Progress Notes (Signed)
Patient is d/ced home.  Has had no c/o abdominal pain or N&V.  Has had good appetite and is on Magace.  Patient able to walk to the bathroom. Very supportive family.  Patient has chosen not to seek tx for pancreatic mass.  Will be [facility].  IV removed by Levada Dy, CNA.  Reviewed d/c instructions, f/u appts.  Patient will leave with family.

## 2016-03-01 NOTE — Plan of Care (Signed)
Problem: Pain Managment: Goal: General experience of comfort will improve Outcome: Progressing Pt slept through the night without complaint  Problem: Activity: Goal: Risk for activity intolerance will decrease Outcome: Progressing Pt able to ambulate to bathroom with assist.

## 2016-03-01 NOTE — Discharge Instructions (Signed)
Palliative care nurse to follow at home. °

## 2016-03-01 NOTE — Discharge Summary (Signed)
[facility] [place] NAME: Lisa Beard    MR#:  [id number]  DATE OF BIRTH:  11/21/1926  DATE OF ADMISSION:  02/28/2016 ADMITTING PHYSICIAN: Saundra Shelling, MD  DATE OF DISCHARGE: 03/01/2016  PRIMARY CARE PHYSICIAN: FELDPAUSCH, Chrissie Noa, MD    ADMISSION DIAGNOSIS:  Generalized abdominal pain [R10.84]  DISCHARGE DIAGNOSIS:  Active Problems:   Hypokalemia   Generalized abdominal pain   Pancreatic mass   Palliative care by specialist   Goals of care, counseling/discussion   SECONDARY DIAGNOSIS:   Past Medical History:  Diagnosis Date  . Anemia   . Arthritis   . Asthma   . Chronic kidney disease   . COPD (chronic obstructive pulmonary disease) ([place])   . Coronary artery disease   . Diabetes mellitus without complication ([place])   . GERD (gastroesophageal reflux disease)   . Hypertension   . Renal insufficiency     HOSPITAL COURSE:   * Hypokalemia   Hyponatremia      Secondary to recurrent diarrhea, continue IV fluid and recheck.   Improved.    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 Vira Agar.   She and her family do not wish to consider chemotherapy or radiation options and would like to meet palliative care and consider hospice.   I had detailed discussion about this and called palliative care Consult to further facilitate arrangements.  they agreed to have a palliative care nurse to come home after discharge.  * Coronary artery disease  Continue aspirin, statin, Imdur.  * COPD   Continue inhalers, no active symptoms.  * Chronic kidney disease stage III, stable continue monitoring.  DISCHARGE CONDITIONS:   Stable.  CONSULTS OBTAINED:    DRUG ALLERGIES:   Allergies  Allergen Reactions  . Biaxin [Clarithromycin]   . Boniva [Ibandronic Acid] Other (See Comments)    Reaction:  GI upset    DISCHARGE MEDICATIONS:   Current Discharge  Medication List    START taking these medications   Details  megestrol (MEGACE) 40 MG tablet Take 1 tablet (40 mg total) by mouth daily. Qty: 30 tablet, Refills: 0    potassium chloride (KLOR-CON) 20 MEQ packet Take 20 mEq by mouth 2 (two) times daily. Qty: 60 packet, Refills: 0      CONTINUE these medications which have NOT CHANGED   Details  acetaminophen (TYLENOL) 325 MG tablet Take 325 mg by mouth every 6 (six) hours as needed for mild pain, moderate pain or fever.    atorvastatin (LIPITOR) 20 MG tablet Take 1 tablet (20 mg total) by mouth daily. Qty: 30 tablet, Refills: 5    Cholecalciferol (VITAMIN D3) 1000 UNITS CAPS Take 1,000 Units by mouth daily.    diphenoxylate-atropine (LOMOTIL) 2.5-0.025 MG per tablet Take 2 tablets by mouth See admin instructions. Take 2 tablets orally once a day in the morning. *Can take up to 8 tablets daily*    Fluticasone-Salmeterol (ADVAIR) 250-50 MCG/DOSE AEPB Inhale 1 puff into the lungs 2 (two) times daily.    isosorbide mononitrate (IMDUR) 30 MG 24 hr tablet Take 1 tablet (30 mg total) by mouth daily. Qty: 30 tablet, Refills: 6    metoprolol succinate (TOPROL-XL) 25 MG 24 hr tablet Take 25 mg by mouth daily.    Pancrelipase, Lip-Prot-Amyl, (CREON) 24000-76000 units CPEP Take 1 capsule by mouth 2 (two) times daily.    pantoprazole (PROTONIX) 40 MG tablet Take  40 mg by mouth daily.    sitaGLIPtin (JANUVIA) 50 MG tablet Take 50 mg by mouth daily.    torsemide (DEMADEX) 10 MG tablet Take 10 mg by mouth 2 (two) times daily.     traZODone (DESYREL) 50 MG tablet Take 50 mg by mouth at bedtime.    aspirin EC 81 MG tablet Take 1 tablet (81 mg total) by mouth daily. Qty: 30 tablet, Refills: 0      STOP taking these medications     spironolactone (ALDACTONE) 25 MG tablet          DISCHARGE INSTRUCTIONS:    Palliative care nurse to follow at home after discharge.  If you experience worsening of your admission symptoms, develop  shortness of breath, life threatening emergency, suicidal or homicidal thoughts you must seek medical attention immediately by calling 911 or calling your MD immediately  if symptoms less severe.  You Must read complete instructions/literature along with all the possible adverse reactions/side effects for all the Medicines you take and that have been prescribed to you. Take any new Medicines after you have completely understood and accept all the possible adverse reactions/side effects.   Please note  You were cared for by a hospitalist during your hospital stay. If you have any questions about your discharge medications or the care you received while you were in the hospital after you are discharged, you can call the unit and asked to speak with the hospitalist on call if the hospitalist that took care of you is not available. Once you are discharged, your primary care physician will handle any further medical issues. Please note that NO REFILLS for any discharge medications will be authorized once you are discharged, as it is imperative that you return to your primary care physician (or establish a relationship with a primary care physician if you do not have one) for your aftercare needs so that they can reassess your need for medications and monitor your lab values.    Today   CHIEF COMPLAINT:   Chief Complaint  Patient presents with  . Cough  . Weakness    HISTORY OF PRESENT ILLNESS:  Lisa Beard  is a 83 y.o. female with a known history of Pancreatic mass, chronic kidney disease, COPD, coronary artery disease, diabetes mellitus type 2, GERD, hypertension presented to the emergency room with the abdominal discomfort and nausea. Patient has not been eating well for the last few days. She has poor appetite and has nausea. Patient complains of abdominal discomfort which is generalized and mostly in the epigastric area. It's aching in nature 3 out of 10 on scale of 1-10. Patient has this  pancreatic mass for which she had a biopsy [facility] according to the family members but did not know exactly whether there is any malignancy. Has generalized weakness. She was evaluated in the emergency room potassium level was low around 2.8. Patient appears dry and dehydrated. Patient was worked up with CT abdomen and chest x-ray in the emergency room. Hospitalist service was consulted for further care of the patient.   VITAL SIGNS:  Blood pressure (!) 127/59, pulse 66, temperature 98.5 F (36.9 C), temperature source Oral, resp. rate 18, height 5\' 2"  (1.575 m), weight 70 kg (154 lb 6.4 oz), SpO2 100 %.  I/O:   Intake/Output Summary (Last 24 hours) at 03/01/16 1237 Last data filed at 03/01/16 0933  Gross per 24 hour  Intake           586.25 ml  Output                0 ml  Net           586.25 ml    PHYSICAL EXAMINATION:  GENERAL:  83 y.o.-year-old patient lying in the bed with no acute distress.  EYES: Pupils equal, round, reactive to light and accommodation. No scleral icterus. Extraocular muscles intact.  HEENT: Head atraumatic, normocephalic. Oropharynx and nasopharynx clear.  NECK:  Supple, no jugular venous distention. No thyroid enlargement, no tenderness.  LUNGS: Normal breath sounds bilaterally, no wheezing, rales,rhonchi or crepitation. No use of accessory muscles of respiration.  CARDIOVASCULAR: S1, S2 normal. No murmurs, rubs, or gallops.  ABDOMEN: Soft, nontender, nondistended. Bowel sounds present. No organomegaly or mass.  EXTREMITIES: No pedal edema, cyanosis, or clubbing.  NEUROLOGIC: Cranial nerves II through XII are intact. Muscle strength 3-4 /5 in all extremities. Sensation intact. Gait not checked.  PSYCHIATRIC: The patient is alert and oriented x 3.  SKIN: No obvious rash, lesion, or ulcer.   DATA REVIEW:   CBC  Recent Labs Lab 02/29/16 1015  WBC 7.3  HGB 10.0*  HCT 29.1*  PLT 255    Chemistries   Recent Labs Lab 02/28/16 2336 02/29/16 1015  02/29/16 1304  NA 133* 135  --   K 2.8* 3.9 4.2  CL 100* 106  --   CO2 23 24  --   GLUCOSE 341* 242*  --   BUN 13 11  --   CREATININE 1.53* 1.35*  --   CALCIUM 8.1* 7.3*  --   AST 19  --   --   ALT 12*  --   --   ALKPHOS 121  --   --   BILITOT 0.5  --   --     Cardiac Enzymes No results for input(s): TROPONINI in the last 168 hours.  Microbiology Results  Results for orders placed or performed in visit on 09/18/13  Clostridium Difficile [facility])     Status: None   Collection Time: 09/18/13  9:43 AM  Result Value Ref Range Status   Micro Text Report   Final       C.DIFFICILE ANTIGEN       C.DIFFICILE GDH ANTIGEN : NEGATIVE   C.DIFFICILE TOXIN A/B     C.DIFFICILE TOXINS A AND B : NEGATIVE   INTERPRETATION            Negative for C. difficile.    ANTIBIOTIC                                                        RADIOLOGY:  Ct Abdomen Pelvis Wo Contrast  Result Date: 02/29/2016 CLINICAL DATA:  Abdominal pain and diarrhea EXAM: CT ABDOMEN AND PELVIS WITHOUT CONTRAST TECHNIQUE: Multidetector CT imaging of the abdomen and pelvis was performed following the standard protocol without IV contrast. COMPARISON:  CT abdomen pelvis 10/21/2015 FINDINGS: Lower chest: No pulmonary nodules. No visible pleural or pericardial effusion. Hepatobiliary: Normal hepatic size and contours. No perihepatic ascites. No intra- or extrahepatic biliary dilatation. Status post cholecystectomy Pancreas: Multilobulated, predominantly cystic mass of the pancreatic head measures 5.7 x 5.0 cm, slightly increased in size. The pancreatic duct remains dilated, unchanged. No peripancreatic fluid collection. Spleen: Normal. Adrenals/Urinary Tract: Normal adrenal glands. No hydronephrosis or nephrolithiasis. No abnormal  perinephric stranding. No ureteral obstruction. Stomach/Bowel: No abnormal bowel dilatation. No bowel wall thickening or adjacent fat stranding to indicate acute inflammation. No abdominal fluid collection.  There is a focus of fat attenuation within the proximal descending colon, not present on the prior examination. The appendix is not seen. Vascular/Lymphatic: There is atherosclerotic calcification of the non aneurysmal abdominal aorta. No abdominal or pelvic lymphadenopathy. Reproductive: Status post hysterectomy. No adnexal mass. No free fluid in the pelvis. Musculoskeletal: No lytic or blastic osseous lesion. Normal visualized extrathoracic and extraperitoneal soft tissues. Other: No contributory non-categorized findings. IMPRESSION: 1. No acute abnormality of the abdomen or pelvis. 2. Slightly increased size predominantly cystic pancreatic head lesion. This remains most consistent with main duct intraductal papillary mucinous neoplasm. Associated dilatation of the pancreatic duct is unchanged. 3. Aortic atherosclerosis. Electronically Signed   By: Ulyses Jarred M.D.   On: 02/29/2016 03:19   US Venous Img Lower Bilateral  Result Date: 02/29/2016 CLINICAL DATA:  83 y/o F; swelling, warmth, and erythema of the lower extremities. EXAM: BILATERAL LOWER EXTREMITY VENOUS DOPPLER ULTRASOUND TECHNIQUE: Gray-scale sonography with graded compression, as well as color Doppler and duplex ultrasound were performed to evaluate the lower extremity deep venous systems from the level of the common femoral vein and including the common femoral, femoral, profunda femoral, popliteal and calf veins including the posterior tibial, peroneal and gastrocnemius veins when visible. The superficial great saphenous vein was also interrogated. Spectral Doppler was utilized to evaluate flow at rest and with distal augmentation maneuvers in the common femoral, femoral and popliteal veins. COMPARISON:  Lower extremity venous Doppler dated 07/26/2014. FINDINGS: RIGHT LOWER EXTREMITY Common Femoral Vein: No evidence of thrombus. Normal compressibility, respiratory phasicity and response to augmentation. Saphenofemoral Junction: No evidence of  thrombus. Normal compressibility and flow on color Doppler imaging. Profunda Femoral Vein: No evidence of thrombus. Normal compressibility and flow on color Doppler imaging. Femoral Vein: No evidence of thrombus. Normal compressibility, respiratory phasicity and response to augmentation. Popliteal Vein: No evidence of thrombus. Normal compressibility, respiratory phasicity and response to augmentation. Calf Veins: No evidence of thrombus. Normal compressibility and flow on color Doppler imaging. Superficial Great Saphenous Vein: No evidence of thrombus. Normal compressibility and flow on color Doppler imaging. Venous Reflux:  None. Other Findings:  None. LEFT LOWER EXTREMITY Common Femoral Vein: No evidence of thrombus. Normal compressibility, respiratory phasicity and response to augmentation. Saphenofemoral Junction: No evidence of thrombus. Normal compressibility and flow on color Doppler imaging. Profunda Femoral Vein: No evidence of thrombus. Normal compressibility and flow on color Doppler imaging. Femoral Vein: No evidence of thrombus. Normal compressibility, respiratory phasicity and response to augmentation. Popliteal Vein: No evidence of thrombus. Normal compressibility, respiratory phasicity and response to augmentation. Calf Veins: No evidence of thrombus. Normal compressibility and flow on color Doppler imaging. Superficial Great Saphenous Vein: No evidence of thrombus. Normal compressibility and flow on color Doppler imaging. Venous Reflux:  None. Other Findings:  None. IMPRESSION: No evidence of deep venous thrombosis. Electronically Signed   By: Kristine Garbe M.D.   On: 02/29/2016 16:11   Dg Chest Portable 1 View  Result Date: 02/29/2016 CLINICAL DATA:  Acute onset of generalized weakness. Diarrhea and hyperglycemia. Confusion. Initial encounter. EXAM: PORTABLE CHEST 1 VIEW COMPARISON:  Chest radiograph performed 10/20/2015, and CT of the chest performed 10/21/2015 FINDINGS: The lungs  are well-aerated. Mild peribronchial thickening is noted. There is no evidence of focal opacification, pleural effusion or pneumothorax. The cardiomediastinal silhouette is within normal limits. No acute  osseous abnormalities are seen. IMPRESSION: Mild peribronchial thickening noted.  Lungs otherwise grossly clear. Electronically Signed   By: Garald Balding M.D.   On: 02/29/2016 00:21    EKG:   Orders placed or performed during the hospital encounter of 10/20/15  . ED EKG  . EKG 12-Lead  . EKG 12-Lead  . ED EKG  . ED EKG  . ED EKG      Management plans discussed with the patient, family and they are in agreement.  CODE STATUS:     Code Status Orders        Start     Ordered   02/29/16 0857  Do not attempt resuscitation (DNR)  Continuous    Question Answer Comment  In the event of cardiac or respiratory ARREST Do not call a "code blue"   In the event of cardiac or respiratory ARREST Do not perform Intubation, CPR, defibrillation or ACLS   In the event of cardiac or respiratory ARREST Use medication by any route, position, wound care, and other measures to relive pain and suffering. May use oxygen, suction and manual treatment of airway obstruction as needed for comfort.      02/29/16 0858    Code Status History    Date Active Date Inactive Code Status Order ID Comments User Context   02/29/2016  6:30 AM 02/29/2016  8:58 AM Full Code [id number]  Saundra Shelling, MD Inpatient   10/25/2014  7:05 PM 10/27/2014  6:55 PM Full Code [id number]  Idelle Crouch, MD Inpatient   05/25/2014  8:41 AM 05/26/2014  5:22 PM Full Code [id number]  Juluis Mire, MD Inpatient      TOTAL TIME TAKING CARE OF THIS PATIENT: 35 minutes.    Vaughan Basta M.D on 03/01/2016 at 12:37 PM  Between 7am to 6pm - Pager - [phone]  After 6pm go to [email or web address] - password [email or web address] [place] [facility]  Office  [phone]  CC: Primary care physician; [facility], Chrissie Noa,  MD   Note: This dictation was prepared with Dragon dictation along with smaller phrase technology. Any transcriptional errors that result from this process are unintentional.

## 2016-03-01 NOTE — Evaluation (Signed)
Physical Therapy Evaluation Patient Details Name: Lisa Beard MRN: [id number] DOB: [birth year] Today's Date: 03/01/2016   History of Present Illness  Pt is an 83 y.o. female presenting to hospital with cough, weakness, diarrhea, and abdominal discomfort.  Pt admitted with hypokalemia and hyponatremia.  PMH includes anemia, CKD, COPD, CAD, DM, htn, pancreatic mass (intraductal mucosal malignancy of pancreas).  Clinical Impression  Prior to hospital admission, pt was modified independent with ambulation using SPC as needed.  Pt lives with her 2 sons and nephew in 1 level home with 3 steps to enter with B railing.  Currently pt is modified independent with bed mobility, modified independent with transfers, SBA with ambulation 150 feet with SPC, and CGA navigating 3 stairs with railing.  No loss of balance noted during session and pt demonstrating appropriate safety with functional mobility.  Pt would benefit from skilled PT to address noted impairments and functional limitations during hospital stay.  Pt appears close to baseline in terms of functional mobility (per pt and pt's family members); no further PT needs anticipated upon hospital discharge.  Recommend pt discharge to home with support of family (pt's daughter reports they can provide 24/7 assist as needed).    Follow Up Recommendations No PT follow up;Supervision - Intermittent    Equipment Recommendations  Kasandra Knudsen (pt already owns [facility] for home use)    Recommendations for Other Services       Precautions / Restrictions Precautions Precautions: Fall Restrictions Weight Bearing Restrictions: No      Mobility  Bed Mobility Overal bed mobility: Modified Independent             General bed mobility comments: Supine to sit with HOB elevated without difficulties.  Transfers Overall transfer level: Modified independent Equipment used: None             General transfer comment: Sit to/from stand and stand step turn pt  utilizing UE support safely and without loss of balance.  Ambulation/Gait Ambulation/Gait assistance: Supervision Ambulation Distance (Feet): 150 Feet Assistive device: Straight cane Gait Pattern/deviations: Step-through pattern Gait velocity: mildly decreased   General Gait Details: mild increased BOS; steady without loss of balance  Stairs Stairs: Yes Stairs assistance: Min guard Stair Management: One rail Left;Alternating pattern;Forwards;Step to pattern Number of Stairs: 3 General stair comments: steady without loss of balance; alternating pattern ascending; step to pattern descending  Wheelchair Mobility    Modified Rankin (Stroke Patients Only)       Balance Overall balance assessment: Needs assistance Sitting-balance support: No upper extremity supported;Feet supported Sitting balance-Leahy Scale: Good     Standing balance support: Single extremity supported;During functional activity (using SPC) Standing balance-Leahy Scale: Good                               Pertinent Vitals/Pain Pain Assessment: No/denies pain  Vitals (HR and O2) stable and WFL throughout treatment session.    Home Living Family/patient expects to be discharged to:: Private residence Living Arrangements: Children;Other relatives (2 sons and nephew) Available Help at Discharge: Family;Available 24 hours/day Type of Home: Mobile home Home Access: Stairs to enter Entrance Stairs-Rails: Right;Left;Can reach both Entrance Stairs-Number of Steps: 3 Home Layout: One level Home Equipment: Walker - 2 wheels;Cane - single point      Prior Function Level of Independence: Independent with assistive device(s)         Comments: Pt uses SPC sometimes in home and usually in community.  Independent with ADL's (tub bath's).  (-) driving.  Pt's daughter assists with errands.       Hand Dominance        Extremity/Trunk Assessment   Upper Extremity Assessment Upper Extremity  Assessment: Overall WFL for tasks assessed    Lower Extremity Assessment Lower Extremity Assessment: Overall WFL for tasks assessed       Communication   Communication: No difficulties  Cognition Arousal/Alertness: Awake/alert Behavior During Therapy: WFL for tasks assessed/performed Overall Cognitive Status: Within Functional Limits for tasks assessed                      General Comments General comments (skin integrity, edema, etc.): Pt's son and daughter present during session.  Nursing cleared pt for participation in physical therapy.  Pt agreeable to PT session.    Exercises     Assessment/Plan    PT Assessment Patent does not need any further PT services  PT Problem List         PT Treatment Interventions      PT Goals (Current goals can be found in the Care Plan section)  Acute Rehab PT Goals Patient Stated Goal: to go home PT Goal Formulation: With patient Time For Goal Achievement: 03/15/16 Potential to Achieve Goals: Good    Frequency     Barriers to discharge        Co-evaluation               End of Session Equipment Utilized During Treatment: Gait belt Activity Tolerance: Patient tolerated treatment well Patient left: in chair;with call bell/phone within reach;with chair alarm set;with family/visitor present Nurse Communication: Mobility status;Precautions PT Visit Diagnosis: Adult, failure to thrive (R62.7)         Time: 1035-1100 PT Time Calculation (min) (ACUTE ONLY): 25 min   Charges:   PT Evaluation $PT Eval Low Complexity: 1 Procedure     PT G CodesLeitha Bleak, PT 03/01/16, 11:23 AM [phone]

## 2016-03-22 DIAGNOSIS — I10 Essential (primary) hypertension: Secondary | ICD-10-CM | POA: Diagnosis not present

## 2016-03-22 DIAGNOSIS — K219 Gastro-esophageal reflux disease without esophagitis: Secondary | ICD-10-CM | POA: Diagnosis not present

## 2016-03-22 DIAGNOSIS — E7800 Pure hypercholesterolemia, unspecified: Secondary | ICD-10-CM | POA: Diagnosis not present

## 2016-03-22 DIAGNOSIS — J449 Chronic obstructive pulmonary disease, unspecified: Secondary | ICD-10-CM | POA: Diagnosis not present

## 2016-03-22 DIAGNOSIS — E1122 Type 2 diabetes mellitus with diabetic chronic kidney disease: Secondary | ICD-10-CM | POA: Diagnosis not present

## 2016-03-22 DIAGNOSIS — D490 Neoplasm of unspecified behavior of digestive system: Secondary | ICD-10-CM | POA: Diagnosis not present

## 2016-03-22 DIAGNOSIS — F5101 Primary insomnia: Secondary | ICD-10-CM | POA: Diagnosis not present

## 2016-03-22 DIAGNOSIS — N183 Chronic kidney disease, stage 3 (moderate): Secondary | ICD-10-CM | POA: Diagnosis not present

## 2016-03-30 ENCOUNTER — Emergency Department
Admission: EM | Admit: 2016-03-30 | Discharge: 2016-03-31 | Disposition: A | Discharge status: Home / Self Care | Payer: Medicare HMO | Attending: Emergency Medicine | Admitting: Emergency Medicine

## 2016-03-30 DIAGNOSIS — J449 Chronic obstructive pulmonary disease, unspecified: Secondary | ICD-10-CM | POA: Insufficient documentation

## 2016-03-30 DIAGNOSIS — N189 Chronic kidney disease, unspecified: Secondary | ICD-10-CM | POA: Diagnosis not present

## 2016-03-30 DIAGNOSIS — I10 Essential (primary) hypertension: Secondary | ICD-10-CM | POA: Diagnosis not present

## 2016-03-30 DIAGNOSIS — Z79899 Other long term (current) drug therapy: Secondary | ICD-10-CM | POA: Diagnosis not present

## 2016-03-30 DIAGNOSIS — Z7984 Long term (current) use of oral hypoglycemic drugs: Secondary | ICD-10-CM | POA: Diagnosis not present

## 2016-03-30 DIAGNOSIS — J45909 Unspecified asthma, uncomplicated: Secondary | ICD-10-CM | POA: Diagnosis not present

## 2016-03-30 DIAGNOSIS — Z7982 Long term (current) use of aspirin: Secondary | ICD-10-CM | POA: Insufficient documentation

## 2016-03-30 DIAGNOSIS — R197 Diarrhea, unspecified: Secondary | ICD-10-CM

## 2016-03-30 DIAGNOSIS — E1122 Type 2 diabetes mellitus with diabetic chronic kidney disease: Secondary | ICD-10-CM | POA: Insufficient documentation

## 2016-03-30 DIAGNOSIS — I129 Hypertensive chronic kidney disease with stage 1 through stage 4 chronic kidney disease, or unspecified chronic kidney disease: Secondary | ICD-10-CM | POA: Diagnosis not present

## 2016-03-30 DIAGNOSIS — I2510 Atherosclerotic heart disease of native coronary artery without angina pectoris: Secondary | ICD-10-CM | POA: Insufficient documentation

## 2016-03-30 LAB — CBC
HEMATOCRIT: 31.1 % — AB (ref 35.0–47.0)
Hemoglobin: 10.4 g/dL — ABNORMAL LOW (ref 12.0–16.0)
MCH: 30.3 pg (ref 26.0–34.0)
MCHC: 33.4 g/dL (ref 32.0–36.0)
MCV: 90.7 fL (ref 80.0–100.0)
PLATELETS: 258 10*3/uL (ref 150–440)
RBC: 3.43 MIL/uL — AB (ref 3.80–5.20)
RDW: 14.3 % (ref 11.5–14.5)
WBC: 9.6 10*3/uL (ref 3.6–11.0)

## 2016-03-30 MED ORDER — SODIUM CHLORIDE 0.9 % IV BOLUS (SEPSIS)
1000.0000 mL | Freq: Once | INTRAVENOUS | Status: AC
Start: 2016-03-30 — End: 2016-03-31
  Administered 2016-03-31: 1000 mL via INTRAVENOUS

## 2016-03-30 NOTE — ED Triage Notes (Signed)
Pt in with co dizziness and weakness since tonight. States heart felt "slow", pt has pancreatic cancer but is not getting treatment at this time. Has had diarrhea no vomiting.

## 2016-03-30 NOTE — ED Provider Notes (Signed)
[facility] Emergency Department Provider Note   First MD Initiated Contact with Patient 03/30/16 2321     (approximate)  I have reviewed the triage vital signs and the nursing notes.   HISTORY  Chief Complaint Chest Pain    HPI Lisa Beard is a 82 y.o. female with below list of chronic medical conditions presents to the emergency department with generalized weakness and multiple episodes of nonbloody diarrhea today. Patient denies any pain but does admit to dizziness with positional change.   Past Medical History:  Diagnosis Date  . Anemia   . Arthritis   . Asthma   . Chronic kidney disease   . COPD (chronic obstructive pulmonary disease) ([place])   . Coronary artery disease   . Diabetes mellitus without complication ([place])   . GERD (gastroesophageal reflux disease)   . Hypertension   . Renal insufficiency     Patient Active Problem List   Diagnosis Date Noted  . Hypokalemia 02/29/2016  . Generalized abdominal pain   . Pancreatic mass   . Palliative care by specialist   . Goals of care, counseling/discussion   . Chronic renal insufficiency 10/27/2014  . Hyponatremia 10/27/2014  . Leukocytosis 10/27/2014  . Left shoulder pain 10/27/2014  . Atypical chest pain 10/25/2014  . Abnormal EKG 10/25/2014  . Chest pain 10/25/2014  . General weakness 05/25/2014  . Elevated troponin 05/25/2014  . CAD (coronary artery disease) 05/25/2014  . HTN (hypertension) 05/25/2014  . DM (diabetes mellitus) ([place]) 05/25/2014  . COPD (chronic obstructive pulmonary disease) ([place]) 05/25/2014  . CKD (chronic kidney disease) 05/25/2014  . Chronic anemia 05/25/2014    Past Surgical History:  Procedure Laterality Date  . ABDOMINAL HYSTERECTOMY    . CHOLECYSTECTOMY    . EYE SURGERY      Prior to Admission medications   Medication Sig Start Date End Date Taking? Authorizing Provider  acetaminophen (TYLENOL) 325 MG tablet Take 325 mg by mouth every 6 (six) hours  as needed for mild pain, moderate pain or fever.   Yes Historical Provider, MD  Cholecalciferol (VITAMIN D3) 1000 UNITS CAPS Take 1,000 Units by mouth daily.   Yes Historical Provider, MD  ferrous sulfate 325 (65 FE) MG tablet Take 325 mg by mouth daily with breakfast.   Yes Historical Provider, MD  Fluticasone-Salmeterol (ADVAIR) 250-50 MCG/DOSE AEPB Inhale 1 puff into the lungs 2 (two) times daily.   Yes Historical Provider, MD  lovastatin (MEVACOR) 20 MG tablet Take 20 mg by mouth at bedtime.   Yes Historical Provider, MD  metoprolol succinate (TOPROL-XL) 25 MG 24 hr tablet Take 25 mg by mouth daily.   Yes Historical Provider, MD  Pancrelipase, Lip-Prot-Amyl, (CREON) 24000-76000 units CPEP Take 1 capsule by mouth 2 (two) times daily. 01/31/16  Yes Historical Provider, MD  pantoprazole (PROTONIX) 40 MG tablet Take 40 mg by mouth daily.   Yes Historical Provider, MD  potassium chloride (KLOR-CON) 20 MEQ packet Take 20 mEq by mouth 2 (two) times daily. 03/01/16  Yes Vaughan Basta, MD  sitaGLIPtin (JANUVIA) 50 MG tablet Take 50 mg by mouth daily.   Yes Historical Provider, MD  sucralfate (CARAFATE) 1 g tablet Take 1 tablet by mouth 4 (four) times daily.   Yes Historical Provider, MD  torsemide (DEMADEX) 10 MG tablet Take 10 mg by mouth 2 (two) times daily.    Yes Historical Provider, MD  traMADol (ULTRAM) 50 MG tablet Take 50 mg by mouth every 6 (six) hours as  needed for pain.   Yes Historical Provider, MD  traZODone (DESYREL) 50 MG tablet Take 50 mg by mouth at bedtime.   Yes Historical Provider, MD  aspirin EC 81 MG tablet Take 1 tablet (81 mg total) by mouth daily. Patient not taking: Reported on 02/29/2016 10/27/14   Theodoro Grist, MD  atorvastatin (LIPITOR) 20 MG tablet Take 1 tablet (20 mg total) by mouth daily. Patient not taking: Reported on 03/31/2016 10/27/14   Theodoro Grist, MD  isosorbide mononitrate (IMDUR) 30 MG 24 hr tablet Take 1 tablet (30 mg total) by mouth daily. 10/27/14    Theodoro Grist, MD  megestrol (MEGACE) 40 MG tablet Take 1 tablet (40 mg total) by mouth daily. 03/02/16   Vaughan Basta, MD    Allergies Biaxin [clarithromycin] and Boniva [ibandronic acid]  Family History  Problem Relation Age of Onset  . CAD Mother   . Cancer Sister   . Hypertension Sister     Social History Social History  Substance Use Topics  . Smoking status: Never Smoker  . Smokeless tobacco: Never Used  . Alcohol use No    Review of Systems Constitutional: No fever/chills Eyes: No visual changes. ENT: No sore throat. Cardiovascular: Denies chest pain. Respiratory: Denies shortness of breath. Gastrointestinal: Positive for diarrhea  Genitourinary: Negative for dysuria. Musculoskeletal: Negative for back pain. Skin: Negative for rash. Neurological: Negative for headaches, focal weakness or numbness.  10-point ROS otherwise negative.  ____________________________________________   PHYSICAL EXAM:  VITAL SIGNS: ED Triage Vitals  Enc Vitals Group     BP 03/30/16 2314 (!) 163/78     Pulse Rate 03/30/16 2314 73     Resp 03/30/16 2314 18     Temp 03/30/16 2314 98.6 F (37 C)     Temp Source 03/30/16 2314 Oral     SpO2 03/30/16 2314 97 %     Weight 03/30/16 2313 152 lb (68.9 kg)     Height --      Head Circumference --      Peak Flow --      Pain Score --      Pain Loc --      Pain Edu? --      Excl. in [place]? --     Constitutional: Alert and oriented. Well appearing and in no acute distress. Eyes: Conjunctivae are normal. PERRL. EOMI. Head: Atraumatic. Mouth/Throat: Mucous membranes are moistDry oral mucosa  Neck: No stridor.  No meningeal signs.  No cervical spine tenderness to palpation. Cardiovascular: Normal rate, regular rhythm. Good peripheral circulation. Grossly normal heart sounds. Respiratory: Normal respiratory effort.  No retractions. Lungs CTAB. Gastrointestinal: Soft and nontender. No distention.  Musculoskeletal: No lower  extremity tenderness nor edema. No gross deformities of extremities. Neurologic:  Normal speech and language. No gross focal neurologic deficits are appreciated.  Skin:  Skin is warm, dry and intact. No rash noted. Psychiatric: Mood and affect are normal. Speech and behavior are normal.  ____________________________________________   LABS (all labs ordered are listed, but only abnormal results are displayed)  Labs Reviewed  CBC - Abnormal; Notable for the following:       Result Value   RBC 3.43 (*)    Hemoglobin 10.4 (*)    HCT 31.1 (*)    All other components within normal limits  COMPREHENSIVE METABOLIC PANEL - Abnormal; Notable for the following:    Sodium 134 (*)    Glucose, Bld 202 (*)    Creatinine, Ser 1.44 (*)  Calcium 8.4 (*)    GFR calc non Af Amer 32 (*)    GFR calc Af Amer 37 (*)    All other components within normal limits  LIPASE, BLOOD - Abnormal; Notable for the following:    Lipase <10 (*)    All other components within normal limits  GASTROINTESTINAL PANEL BY PCR, STOOL (REPLACES STOOL CULTURE)  C DIFFICILE QUICK SCREEN W PCR REFLEX  TROPONIN I  TROPONIN I   ____________________________________________  EKG  ED ECG REPORT I, [place] Letty Salvi, the attending physician, personally viewed and interpreted this ECG.   Date: 03/31/2016  EKG Time: 11:17 PM  Rate: 68  Rhythm: Normal sinus rhythm right bundle branch block  Axis: Normal  Intervals: Normal  ST&T Change: None      Procedures   ____________________________________________   INITIAL IMPRESSION / ASSESSMENT AND PLAN / ED COURSE  Pertinent labs & imaging results that were available during my care of the patient were reviewed by me and considered in my medical decision making (see chart for details).  83 year old female with multiple chronic medical conditions including pancreatic cancer presents to the emergency department with diarrhea. Laboratory data revealed no acute  abnormality with exception of an elevated creatinine of 1.44. Patient received 1 L IV normal saline. Patient stating that she feels much improved at this time. Spoke with the patient's family at length regarding necessity of following with primary care provider for further outpatient evaluation      ____________________________________________  FINAL CLINICAL IMPRESSION(S) / ED DIAGNOSES  Final diagnoses:  Diarrhea, unspecified type     MEDICATIONS GIVEN DURING THIS VISIT:  Medications  sodium chloride 0.9 % bolus 1,000 mL (0 mLs Intravenous Stopped 03/31/16 0143)     NEW OUTPATIENT MEDICATIONS STARTED DURING THIS VISIT:  Discharge Medication List as of 03/31/2016  4:30 AM      Discharge Medication List as of 03/31/2016  4:30 AM      Discharge Medication List as of 03/31/2016  4:30 AM       Note:  This document was prepared using Dragon voice recognition software and may include unintentional dictation errors.    Gregor Hams, MD 03/31/16 2232

## 2016-03-31 LAB — COMPREHENSIVE METABOLIC PANEL
ALBUMIN: 3.5 g/dL (ref 3.5–5.0)
ALK PHOS: 123 U/L (ref 38–126)
ALT: 15 U/L (ref 14–54)
AST: 15 U/L (ref 15–41)
Anion gap: 6 (ref 5–15)
BUN: 16 mg/dL (ref 6–20)
CHLORIDE: 104 mmol/L (ref 101–111)
CO2: 24 mmol/L (ref 22–32)
CREATININE: 1.44 mg/dL — AB (ref 0.44–1.00)
Calcium: 8.4 mg/dL — ABNORMAL LOW (ref 8.9–10.3)
GFR calc Af Amer: 37 mL/min — ABNORMAL LOW (ref >60)
GFR calc non Af Amer: 32 mL/min — ABNORMAL LOW (ref >60)
GLUCOSE: 202 mg/dL — AB (ref 65–99)
POTASSIUM: 3.5 mmol/L (ref 3.5–5.1)
SODIUM: 134 mmol/L — AB (ref 135–145)
Total Bilirubin: 0.7 mg/dL (ref 0.3–1.2)
Total Protein: 6.6 g/dL (ref 6.5–8.1)

## 2016-03-31 LAB — GASTROINTESTINAL PANEL BY PCR, STOOL (REPLACES STOOL CULTURE)

## 2016-03-31 LAB — C DIFFICILE QUICK SCREEN W PCR REFLEX
C DIFFICLE (CDIFF) ANTIGEN: NEGATIVE
C Diff interpretation: NOT DETECTED
C Diff toxin: NEGATIVE

## 2016-03-31 LAB — LIPASE, BLOOD

## 2016-03-31 LAB — TROPONIN I

## 2016-03-31 MED ORDER — LOPERAMIDE HCL 2 MG PO CAPS: 4.0000 mg | ORAL_CAPSULE | Freq: Once | ORAL | Status: DC

## 2016-04-12 ENCOUNTER — Emergency Department: Payer: Medicare HMO

## 2016-04-12 ENCOUNTER — Emergency Department
Admission: EM | Admit: 2016-04-12 | Discharge: 2016-04-12 | Disposition: A | Discharge status: Home / Self Care | Payer: Medicare HMO | Attending: Emergency Medicine | Admitting: Emergency Medicine

## 2016-04-12 ENCOUNTER — Encounter: Payer: Self-pay | Admitting: Intensive Care

## 2016-04-12 DIAGNOSIS — I129 Hypertensive chronic kidney disease with stage 1 through stage 4 chronic kidney disease, or unspecified chronic kidney disease: Secondary | ICD-10-CM | POA: Insufficient documentation

## 2016-04-12 DIAGNOSIS — N189 Chronic kidney disease, unspecified: Secondary | ICD-10-CM | POA: Insufficient documentation

## 2016-04-12 DIAGNOSIS — F17290 Nicotine dependence, other tobacco product, uncomplicated: Secondary | ICD-10-CM | POA: Insufficient documentation

## 2016-04-12 DIAGNOSIS — Z7982 Long term (current) use of aspirin: Secondary | ICD-10-CM | POA: Diagnosis not present

## 2016-04-12 DIAGNOSIS — I2510 Atherosclerotic heart disease of native coronary artery without angina pectoris: Secondary | ICD-10-CM | POA: Diagnosis not present

## 2016-04-12 DIAGNOSIS — R600 Localized edema: Secondary | ICD-10-CM | POA: Insufficient documentation

## 2016-04-12 DIAGNOSIS — E1122 Type 2 diabetes mellitus with diabetic chronic kidney disease: Secondary | ICD-10-CM | POA: Diagnosis not present

## 2016-04-12 DIAGNOSIS — R51 Headache: Secondary | ICD-10-CM | POA: Insufficient documentation

## 2016-04-12 DIAGNOSIS — H538 Other visual disturbances: Secondary | ICD-10-CM | POA: Insufficient documentation

## 2016-04-12 DIAGNOSIS — R55 Syncope and collapse: Secondary | ICD-10-CM | POA: Insufficient documentation

## 2016-04-12 DIAGNOSIS — Z79899 Other long term (current) drug therapy: Secondary | ICD-10-CM | POA: Diagnosis not present

## 2016-04-12 DIAGNOSIS — R2242 Localized swelling, mass and lump, left lower limb: Secondary | ICD-10-CM | POA: Diagnosis not present

## 2016-04-12 DIAGNOSIS — R0602 Shortness of breath: Secondary | ICD-10-CM | POA: Diagnosis not present

## 2016-04-12 DIAGNOSIS — J449 Chronic obstructive pulmonary disease, unspecified: Secondary | ICD-10-CM | POA: Diagnosis not present

## 2016-04-12 DIAGNOSIS — R609 Edema, unspecified: Secondary | ICD-10-CM

## 2016-04-12 DIAGNOSIS — R519 Headache, unspecified: Secondary | ICD-10-CM

## 2016-04-12 DIAGNOSIS — J45909 Unspecified asthma, uncomplicated: Secondary | ICD-10-CM | POA: Diagnosis not present

## 2016-04-12 DIAGNOSIS — H539 Unspecified visual disturbance: Secondary | ICD-10-CM | POA: Diagnosis not present

## 2016-04-12 LAB — GLUCOSE, CAPILLARY: GLUCOSE-CAPILLARY: 176 mg/dL — AB (ref 65–99)

## 2016-04-12 LAB — URINALYSIS, COMPLETE (UACMP) WITH MICROSCOPIC
Bacteria, UA: NONE SEEN
Bilirubin Urine: NEGATIVE
Glucose, UA: 50 mg/dL — AB
Hgb urine dipstick: NEGATIVE
KETONES UR: NEGATIVE mg/dL
Leukocytes, UA: NEGATIVE
Nitrite: NEGATIVE
PH: 5 (ref 5.0–8.0)
PROTEIN: NEGATIVE mg/dL
RBC / HPF: NONE SEEN RBC/hpf (ref 0–5)
SQUAMOUS EPITHELIAL / LPF: NONE SEEN
Specific Gravity, Urine: 1.006 (ref 1.005–1.030)

## 2016-04-12 LAB — CBC WITH DIFFERENTIAL/PLATELET
BASOS ABS: 0 10*3/uL (ref 0–0.1)
Basophils Relative: 0 %
EOS PCT: 2 %
Eosinophils Absolute: 0.1 10*3/uL (ref 0–0.7)
HCT: 32.4 % — ABNORMAL LOW (ref 35.0–47.0)
Hemoglobin: 10.7 g/dL — ABNORMAL LOW (ref 12.0–16.0)
LYMPHS ABS: 0.8 10*3/uL — AB (ref 1.0–3.6)
Lymphocytes Relative: 11 %
MCH: 30.4 pg (ref 26.0–34.0)
MCHC: 33.1 g/dL (ref 32.0–36.0)
MCV: 91.8 fL (ref 80.0–100.0)
MONO ABS: 0.6 10*3/uL (ref 0.2–0.9)
MONOS PCT: 7 %
NEUTROS ABS: 6.4 10*3/uL (ref 1.4–6.5)
NEUTROS PCT: 80 %
PLATELETS: 305 10*3/uL (ref 150–440)
RBC: 3.53 MIL/uL — ABNORMAL LOW (ref 3.80–5.20)
RDW: 14.7 % — AB (ref 11.5–14.5)
WBC: 8 10*3/uL (ref 3.6–11.0)

## 2016-04-12 LAB — BASIC METABOLIC PANEL
Anion gap: 8 (ref 5–15)
BUN: 20 mg/dL (ref 6–20)
CO2: 25 mmol/L (ref 22–32)
Calcium: 8.1 mg/dL — ABNORMAL LOW (ref 8.9–10.3)
Chloride: 101 mmol/L (ref 101–111)
Creatinine, Ser: 1.57 mg/dL — ABNORMAL HIGH (ref 0.44–1.00)
GFR calc Af Amer: 33 mL/min — ABNORMAL LOW (ref >60)
GFR, EST NON AFRICAN AMERICAN: 29 mL/min — AB (ref >60)
GLUCOSE: 264 mg/dL — AB (ref 65–99)
POTASSIUM: 3.7 mmol/L (ref 3.5–5.1)
Sodium: 134 mmol/L — ABNORMAL LOW (ref 135–145)

## 2016-04-12 LAB — TROPONIN I: Troponin I: <0.03 ng/mL (ref <0.03)

## 2016-04-12 MED ORDER — ACETAMINOPHEN 325 MG PO TABS
650.0000 mg | ORAL_TABLET | Freq: Once | ORAL | Status: AC
  Administered 2016-04-12: 650 mg via ORAL
  Filled 2016-04-12: qty 2

## 2016-04-12 MED ORDER — SODIUM CHLORIDE 0.9 % IV BOLUS (SEPSIS)
500.0000 mL | Freq: Once | INTRAVENOUS | Status: AC
  Administered 2016-04-12: 500 mL via INTRAVENOUS

## 2016-04-12 NOTE — ED Notes (Signed)
Patient c/o headache since yesterday, weakness and blurry vision in right eye that began today.

## 2016-04-12 NOTE — ED Provider Notes (Signed)
[facility] Emergency Department Provider Note  ____________________________________________   First MD Initiated Contact with Patient 04/12/16 1512     (approximate)  I have reviewed the triage vital signs and the nursing notes.   HISTORY  Chief Complaint Weakness and Near Syncope   HPI Lisa Beard is a 83 y.o. female with a history of hypertension and renal insufficiency was presenting to the emergency department today with the nursing below episode. She said that she stood up from a sitting position and felt short of breath. She is denying any chest pain or palpitations at this time to the Jude episode several days ago. She is also complaining of left lower extremity swelling over the past week. She normally has a baseline bilateral lower extremity swelling but says that this left lower extremity being more swollen the right lower extremity is new from previous. She says that she also has intermittent blurred vision to the right eye and also headache to the right side which has been off and on over the past year. She says that she has had an evaluated by ophthalmology and is not currently on any medication for this. She says the pain is a 4 out of 10 at this time and pressure-like and over the right frontal/temporal area.   Past Medical History:  Diagnosis Date  . Anemia   . Arthritis   . Asthma   . Chronic kidney disease   . COPD (chronic obstructive pulmonary disease) ([place])   . Coronary artery disease   . Diabetes mellitus without complication ([place])   . GERD (gastroesophageal reflux disease)   . Hypertension   . Renal insufficiency     Patient Active Problem List   Diagnosis Date Noted  . Hypokalemia 02/29/2016  . Generalized abdominal pain   . Pancreatic mass   . Palliative care by specialist   . Goals of care, counseling/discussion   . Chronic renal insufficiency 10/27/2014  . Hyponatremia 10/27/2014  . Leukocytosis 10/27/2014  . Left  shoulder pain 10/27/2014  . Atypical chest pain 10/25/2014  . Abnormal EKG 10/25/2014  . Chest pain 10/25/2014  . General weakness 05/25/2014  . Elevated troponin 05/25/2014  . CAD (coronary artery disease) 05/25/2014  . HTN (hypertension) 05/25/2014  . DM (diabetes mellitus) ([place]) 05/25/2014  . COPD (chronic obstructive pulmonary disease) ([place]) 05/25/2014  . CKD (chronic kidney disease) 05/25/2014  . Chronic anemia 05/25/2014    Past Surgical History:  Procedure Laterality Date  . ABDOMINAL HYSTERECTOMY    . CHOLECYSTECTOMY    . EYE SURGERY      Prior to Admission medications   Medication Sig Start Date End Date Taking? Authorizing Provider  acetaminophen (TYLENOL) 325 MG tablet Take 325 mg by mouth every 6 (six) hours as needed for mild pain, moderate pain or fever.    Historical Provider, MD  aspirin EC 81 MG tablet Take 1 tablet (81 mg total) by mouth daily. Patient not taking: Reported on 02/29/2016 10/27/14   Theodoro Grist, MD  atorvastatin (LIPITOR) 20 MG tablet Take 1 tablet (20 mg total) by mouth daily. Patient not taking: Reported on 03/31/2016 10/27/14   Theodoro Grist, MD  Cholecalciferol (VITAMIN D3) 1000 UNITS CAPS Take 1,000 Units by mouth daily.    Historical Provider, MD  ferrous sulfate 325 (65 FE) MG tablet Take 325 mg by mouth daily with breakfast.    Historical Provider, MD  Fluticasone-Salmeterol (ADVAIR) 250-50 MCG/DOSE AEPB Inhale 1 puff into the lungs 2 (two) times  daily.    Historical Provider, MD  isosorbide mononitrate (IMDUR) 30 MG 24 hr tablet Take 1 tablet (30 mg total) by mouth daily. 10/27/14   Theodoro Grist, MD  lovastatin (MEVACOR) 20 MG tablet Take 20 mg by mouth at bedtime.    Historical Provider, MD  megestrol (MEGACE) 40 MG tablet Take 1 tablet (40 mg total) by mouth daily. 03/02/16   Vaughan Basta, MD  metoprolol succinate (TOPROL-XL) 25 MG 24 hr tablet Take 25 mg by mouth daily.    Historical Provider, MD  Pancrelipase, Lip-Prot-Amyl,  (CREON) 24000-76000 units CPEP Take 1 capsule by mouth 2 (two) times daily. 01/31/16   Historical Provider, MD  pantoprazole (PROTONIX) 40 MG tablet Take 40 mg by mouth daily.    Historical Provider, MD  potassium chloride (KLOR-CON) 20 MEQ packet Take 20 mEq by mouth 2 (two) times daily. 03/01/16   Vaughan Basta, MD  sitaGLIPtin (JANUVIA) 50 MG tablet Take 50 mg by mouth daily.    Historical Provider, MD  sucralfate (CARAFATE) 1 g tablet Take 1 tablet by mouth 4 (four) times daily.    Historical Provider, MD  torsemide (DEMADEX) 10 MG tablet Take 10 mg by mouth 2 (two) times daily.     Historical Provider, MD  traMADol (ULTRAM) 50 MG tablet Take 50 mg by mouth every 6 (six) hours as needed for pain.    Historical Provider, MD  traZODone (DESYREL) 50 MG tablet Take 50 mg by mouth at bedtime.    Historical Provider, MD    Allergies Biaxin [clarithromycin] and Boniva [ibandronic acid]  Family History  Problem Relation Age of Onset  . CAD Mother   . Cancer Sister   . Hypertension Sister     Social History Social History  Substance Use Topics  . Smoking status: Never Smoker  . Smokeless tobacco: Current User    Types: Snuff  . Alcohol use No    Review of Systems Constitutional: No fever/chills Eyes: as above ENT: No sore throat. Cardiovascular: Denies chest pain. Respiratory: as above Gastrointestinal: No abdominal pain.  No nausea, no vomiting.  No diarrhea.  No constipation. Genitourinary: Negative for dysuria. Musculoskeletal: Negative for back pain. Skin: Negative for rash. Neurological: Negative for headaches, focal weakness or numbness.  10-point ROS otherwise negative.  ____________________________________________   PHYSICAL EXAM:  VITAL SIGNS: ED Triage Vitals  Enc Vitals Group     BP 04/12/16 1403 114/80     Pulse Rate 04/12/16 1403 95     Resp 04/12/16 1403 20     Temp 04/12/16 1403 99.3 F (37.4 C)     Temp Source 04/12/16 1403 Oral     SpO2  04/12/16 1403 95 %     Weight 04/12/16 1403 153 lb (69.4 kg)     Height 04/12/16 1403 5\' 2"  (1.575 m)     Head Circumference --      Peak Flow --      Pain Score 04/12/16 1402 5     Pain Loc --      Pain Edu? --      Excl. in [place]? --     Constitutional: Alert and oriented. Well appearing and in no acute distress. Eyes: Conjunctivae are normal. PERRL. EOMI.  Appears to equal vision bilaterally. When covering each eye is able to then for the number fingers I'm holding up about 4 feet away. Head: Atraumatic.No tenderness palpation along the distribution of the right temporal artery. No nodularity. Nose: No congestion/rhinnorhea. Mouth/Throat: Mucous membranes are  moist.  Neck: No stridor.   Cardiovascular: Normal rate, regular rhythm. Grossly normal heart sounds.  With equal and bilateral dorsalis pedis pulses. Respiratory: Normal respiratory effort.  No retractions. Lungs CTAB. Gastrointestinal: Soft and nontender. No distention. Musculoskeletal: No lower extremity tenderness. Mild to moderate bilateral lower extremity edema with the left lower extremity having moderate to border lined severe edema to the left calf. No swelling to the bilateral feet. Sensation is intact to light touch. Neurologic:  Normal speech and language. No gross focal neurologic deficits are appreciated.  Skin:  Skin is warm, dry and intact. No rash noted. Psychiatric: Mood and affect are normal. Speech and behavior are normal.  ____________________________________________   LABS (all labs ordered are listed, but only abnormal results are displayed)  Labs Reviewed  BASIC METABOLIC PANEL - Abnormal; Notable for the following:       Result Value   Sodium 134 (*)    Glucose, Bld 264 (*)    Creatinine, Ser 1.57 (*)    Calcium 8.1 (*)    GFR calc non Af Amer 29 (*)    GFR calc Af Amer 33 (*)    All other components within normal limits  URINALYSIS, COMPLETE (UACMP) WITH MICROSCOPIC - Abnormal; Notable for the  following:    Color, Urine STRAW (*)    APPearance CLEAR (*)    Glucose, UA 50 (*)    All other components within normal limits  CBC WITH DIFFERENTIAL/PLATELET - Abnormal; Notable for the following:    RBC 3.53 (*)    Hemoglobin 10.7 (*)    HCT 32.4 (*)    RDW 14.7 (*)    Lymphs Abs 0.8 (*)    All other components within normal limits  GLUCOSE, CAPILLARY - Abnormal; Notable for the following:    Glucose-Capillary 176 (*)    All other components within normal limits  TROPONIN I  TROPONIN I  CBG MONITORING, ED   ____________________________________________  EKG  ED ECG REPORT I, Doran Stabler, the attending physician, personally viewed and interpreted this ECG.   Date: 04/12/2016  EKG Time: 1359  Rate: 94  Rhythm: normal sinus rhythm  Axis: Left axis deviation  Intervals: Right bundle branch block  ST&T Change: T wave inversions in 3 and aVF as well as V3. No ST elevations or depressions.  ____________________________________________  [id number]    DG Chest 2 View (Final result)  Result time 04/12/16 74:25:95  Final result by Lowella Grip III, MD (04/12/16 63:87:56)           Narrative:   CLINICAL DATA: Shortness of breath and lower extremity edema  EXAM: CHEST 2 VIEW  COMPARISON: February 28, 2016  FINDINGS: There is no edema or consolidation. Heart is slightly enlarged with pulmonary vascularity within normal limits. There is calcification in the left anterior descending coronary artery. There is also aortic atherosclerotic calcification. No adenopathy. No evident bone lesions.  IMPRESSION: Slight cardiac enlargement. No edema or consolidation. There is aortic atherosclerosis as well as calcification in the left anterior descending coronary artery.   Electronically Signed By: Lowella Grip III M.D. On: 04/12/2016 17:26            CT Head Wo Contrast (Final result)  Result time 04/12/16 16:46:08  Final result by Sabino Dick, MD (04/12/16 16:46:08)           Narrative:   CLINICAL DATA: Headache.  EXAM: CT HEAD WITHOUT CONTRAST  TECHNIQUE: Contiguous axial images were obtained from the base  of the skull through the vertex without intravenous contrast.  COMPARISON: CT scan of February 25, 2013.  FINDINGS: Brain: Mild diffuse cortical atrophy is noted. Minimal chronic ischemic white matter disease is noted. No mass effect or midline shift is noted. Ventricular size is within normal limits. There is no evidence of mass lesion, hemorrhage or acute infarction.  Vascular: No hyperdense vessel or unexpected calcification.  Skull: Normal. Negative for fracture or focal lesion.  Sinuses/Orbits: No acute finding.  Other: None.  IMPRESSION: Mild diffuse cortical atrophy. Minimal chronic ischemic white matter disease. No acute intracranial abnormality seen.   Electronically Signed By: Marijo Conception, M.D. On: 04/12/2016 16:46            US Venous Img Lower Unilateral Left (Final result)  Result time 04/12/16 16:43:48  Final result by Rolla Flatten, MD (04/12/16 16:43:48)           Narrative:   CLINICAL DATA: LEFT lower extremity swelling for 4 days.  EXAM: LEFT LOWER EXTREMITY VENOUS DOPPLER ULTRASOUND  TECHNIQUE: Gray-scale sonography with graded compression, as well as color Doppler and duplex ultrasound were performed to evaluate the lower extremity deep venous systems from the level of the common femoral vein and including the common femoral, femoral, profunda femoral, popliteal and calf veins including the posterior tibial, peroneal and gastrocnemius veins when visible. The superficial great saphenous vein was also interrogated. Spectral Doppler was utilized to evaluate flow at rest and with distal augmentation maneuvers in the common femoral, femoral and popliteal veins.  COMPARISON: Negative BILATERAL exam 03/08/2016.  FINDINGS: Contralateral Common Femoral  Vein: Respiratory phasicity is normal and symmetric with the symptomatic side. No evidence of thrombus. Normal compressibility.  Common Femoral Vein: No evidence of thrombus. Normal compressibility, respiratory phasicity and response to augmentation.  Saphenofemoral Junction: No evidence of thrombus. Normal compressibility and flow on color Doppler imaging.  Profunda Femoral Vein: No evidence of thrombus. Normal compressibility and flow on color Doppler imaging.  Femoral Vein: No evidence of thrombus. Normal compressibility, respiratory phasicity and response to augmentation.  Popliteal Vein: No evidence of thrombus. Normal compressibility, respiratory phasicity and response to augmentation.  Calf Veins: No evidence of thrombus. Normal compressibility and flow on color Doppler imaging.  Superficial Great Saphenous Vein: No evidence of thrombus. Normal compressibility and flow on color Doppler imaging.  Venous Reflux: None.  Other Findings: Limited views of the LEFT posterior tibial and peroneal veins due to swelling.  IMPRESSION: No evidence of LEFT lower extremity deep venous thrombosis.   Electronically Signed By: Staci Righter M.D. On: 04/12/2016 16:43            ____________________________________________   PROCEDURES  Procedure(s) performed:   Procedures  Critical Care performed:   ____________________________________________   INITIAL IMPRESSION / ASSESSMENT AND PLAN / ED COURSE  Pertinent labs & imaging results that were available during my care of the patient were reviewed by me and considered in my medical decision making (see chart for details).  ----------------------------------------- 6:09 PM on 04/12/2016 -----------------------------------------  Patient with very reassuring workup. No acute findings on the imaging with the blood work. Likely brief episode of hypotension secondary to getting up from a sitting position. Right  sided headache with blurriness of vision in the right eye chronic over the past year. Very unlikely to be temporal arteritis due to the ongoing for a year without blindness. Patient's vision appears satisfactory at this time on bedside testing. Patient will follow up with her primary care doctor as well  as with ophthalmology. Explained this plan as well as the results to the patient and her family and they're understanding and willing to comply.      ____________________________________________   FINAL CLINICAL IMPRESSION(S) / ED DIAGNOSES  Near syncope. Blurred vision. Peripheral edema.    NEW MEDICATIONS STARTED DURING THIS VISIT:  New Prescriptions   No medications on file     Note:  This document was prepared using Dragon voice recognition software and may include unintentional dictation errors.    Orbie Pyo, MD 04/12/16 [phone]

## 2016-04-12 NOTE — ED Triage Notes (Signed)
Patient and her son report " I felt like I couldn't breathe or stand at home earlier so my son called EMS" Patient also has L leg/foot swelling, weakness in lower extremities and reports "my head feels so heavy, I can hardly hold it up" Denies N/V/D Denies SOB or chest pain at this time. A&O x4

## 2016-04-12 NOTE — ED Notes (Signed)
E-sign not working. Patient signed paper copy.

## 2016-04-12 NOTE — ED Notes (Signed)
Patient transported to X-ray 

## 2016-04-12 NOTE — ED Notes (Signed)
Patient has been put on the bedpan, prior to going to ultrasound.

## 2016-04-24 DIAGNOSIS — E538 Deficiency of other specified B group vitamins: Secondary | ICD-10-CM | POA: Diagnosis not present

## 2016-05-15 DIAGNOSIS — G893 Neoplasm related pain (acute) (chronic): Secondary | ICD-10-CM | POA: Diagnosis not present

## 2016-05-15 DIAGNOSIS — C259 Malignant neoplasm of pancreas, unspecified: Secondary | ICD-10-CM | POA: Diagnosis not present

## 2016-05-15 DIAGNOSIS — Z515 Encounter for palliative care: Secondary | ICD-10-CM | POA: Diagnosis not present

## 2016-05-24 DIAGNOSIS — E538 Deficiency of other specified B group vitamins: Secondary | ICD-10-CM | POA: Diagnosis not present

## 2016-05-30 ENCOUNTER — Emergency Department

## 2016-05-30 ENCOUNTER — Encounter: Payer: Self-pay | Admitting: Emergency Medicine

## 2016-05-30 ENCOUNTER — Emergency Department
Admission: EM | Admit: 2016-05-30 | Discharge: 2016-05-31 | Disposition: A | Discharge status: Home / Self Care | Attending: Student in an Organized Health Care Education/Training Program | Admitting: Student in an Organized Health Care Education/Training Program

## 2016-05-30 DIAGNOSIS — R531 Weakness: Secondary | ICD-10-CM | POA: Insufficient documentation

## 2016-05-30 DIAGNOSIS — J449 Chronic obstructive pulmonary disease, unspecified: Secondary | ICD-10-CM | POA: Diagnosis not present

## 2016-05-30 DIAGNOSIS — I6529 Occlusion and stenosis of unspecified carotid artery: Secondary | ICD-10-CM | POA: Diagnosis not present

## 2016-05-30 DIAGNOSIS — I2510 Atherosclerotic heart disease of native coronary artery without angina pectoris: Secondary | ICD-10-CM | POA: Diagnosis not present

## 2016-05-30 DIAGNOSIS — I129 Hypertensive chronic kidney disease with stage 1 through stage 4 chronic kidney disease, or unspecified chronic kidney disease: Secondary | ICD-10-CM | POA: Insufficient documentation

## 2016-05-30 DIAGNOSIS — M542 Cervicalgia: Secondary | ICD-10-CM | POA: Insufficient documentation

## 2016-05-30 DIAGNOSIS — Z7982 Long term (current) use of aspirin: Secondary | ICD-10-CM | POA: Diagnosis not present

## 2016-05-30 DIAGNOSIS — E1122 Type 2 diabetes mellitus with diabetic chronic kidney disease: Secondary | ICD-10-CM | POA: Insufficient documentation

## 2016-05-30 DIAGNOSIS — R1310 Dysphagia, unspecified: Secondary | ICD-10-CM | POA: Insufficient documentation

## 2016-05-30 DIAGNOSIS — F17290 Nicotine dependence, other tobacco product, uncomplicated: Secondary | ICD-10-CM | POA: Diagnosis not present

## 2016-05-30 DIAGNOSIS — N189 Chronic kidney disease, unspecified: Secondary | ICD-10-CM | POA: Insufficient documentation

## 2016-05-30 DIAGNOSIS — J45909 Unspecified asthma, uncomplicated: Secondary | ICD-10-CM | POA: Insufficient documentation

## 2016-05-30 DIAGNOSIS — Z79899 Other long term (current) drug therapy: Secondary | ICD-10-CM | POA: Insufficient documentation

## 2016-05-30 HISTORY — DX: Malignant neoplasm of pancreas, unspecified: C25.9

## 2016-05-30 LAB — BASIC METABOLIC PANEL WITH GFR
Anion gap: 9 (ref 5–15)
BUN: 12 mg/dL (ref 6–20)
CO2: 26 mmol/L (ref 22–32)
Calcium: 8.3 mg/dL — ABNORMAL LOW (ref 8.9–10.3)
Chloride: 98 mmol/L — ABNORMAL LOW (ref 101–111)
Creatinine, Ser: 1.43 mg/dL — ABNORMAL HIGH (ref 0.44–1.00)
GFR calc Af Amer: 37 mL/min — ABNORMAL LOW (ref >60)
GFR calc non Af Amer: 32 mL/min — ABNORMAL LOW (ref >60)
Glucose, Bld: 346 mg/dL — ABNORMAL HIGH (ref 65–99)
Potassium: 3.2 mmol/L — ABNORMAL LOW (ref 3.5–5.1)
Sodium: 133 mmol/L — ABNORMAL LOW (ref 135–145)

## 2016-05-30 LAB — URINALYSIS, COMPLETE (UACMP) WITH MICROSCOPIC
BILIRUBIN URINE: NEGATIVE
Glucose, UA: >=500 mg/dL — AB
Ketones, ur: NEGATIVE mg/dL
NITRITE: NEGATIVE
Protein, ur: NEGATIVE mg/dL
SPECIFIC GRAVITY, URINE: 1.001 — AB (ref 1.005–1.030)
pH: 6 (ref 5.0–8.0)

## 2016-05-30 LAB — CBC
HCT: 33.2 % — ABNORMAL LOW (ref 35.0–47.0)
Hemoglobin: 11.2 g/dL — ABNORMAL LOW (ref 12.0–16.0)
MCH: 30.3 pg (ref 26.0–34.0)
MCHC: 33.6 g/dL (ref 32.0–36.0)
MCV: 90.2 fL (ref 80.0–100.0)
PLATELETS: 279 10*3/uL (ref 150–440)
RBC: 3.68 MIL/uL — ABNORMAL LOW (ref 3.80–5.20)
RDW: 14.1 % (ref 11.5–14.5)
WBC: 7.9 10*3/uL (ref 3.6–11.0)

## 2016-05-30 LAB — TROPONIN I: Troponin I: <0.03 ng/mL (ref <0.03)

## 2016-05-30 MED ORDER — CEPHALEXIN 250 MG PO CAPS: 250.0000 mg | ORAL_CAPSULE | Freq: Three times a day (TID) | ORAL | 0 refills | Status: AC

## 2016-05-30 MED ORDER — SODIUM CHLORIDE 0.9 % IV BOLUS (SEPSIS)
1000.0000 mL | Freq: Once | INTRAVENOUS | Status: AC
  Administered 2016-05-30: 1000 mL via INTRAVENOUS

## 2016-05-30 MED ORDER — CEPHALEXIN 500 MG PO CAPS
500.0000 mg | ORAL_CAPSULE | Freq: Once | ORAL | Status: AC
  Administered 2016-05-31: 500 mg via ORAL
  Filled 2016-05-30: qty 1

## 2016-05-30 NOTE — ED Notes (Signed)
Pt states tonight that she has trouble swallowing some crackers and dip. Denies hx of dysphagia "not like this." When asked about SOB pt denied but then states she gets SOB with movement. Pt states bilat neck "pulling" and posterior neck as well. Granddaughter states it's muscular. Pt does not appear in distress, talking in complete sentences. Following all commands.

## 2016-05-30 NOTE — ED Notes (Signed)
Pt taken to CT via stretcher.

## 2016-05-30 NOTE — ED Provider Notes (Signed)
[facility] Department Provider Note    First MD Initiated Contact with Patient 05/30/16 2110     (approximate)  I have reviewed the triage vital signs and the nursing notes.   HISTORY  Chief Complaint Neck Pain    HPI Lisa Beard is a 83 y.o. female presents with several days of difficulty swallowing and left upper extremity weakness. Patient describes it as a pulling sensation in the left neck. No trouble breathing. No recent fevers. States that her head does feel "swimmy headed ." Patient also states that she had an episode of difficulty swallowing earlier this morning.    She denies any paresthesias or pain however. Is concerned about her having elevated blood sugars. Patient was recently diagnosed with pancreatic cancer and has had visits with hospice care. She denies any chest pain or shortness of breath. No recent trauma.   Past Medical History:  Diagnosis Date  . Anemia   . Arthritis   . Asthma   . Chronic kidney disease   . COPD (chronic obstructive pulmonary disease) ([place])   . Coronary artery disease   . Diabetes mellitus without complication ([place])   . GERD (gastroesophageal reflux disease)   . Hypertension   . Pancreatic cancer ([place])   . Renal insufficiency    Family History  Problem Relation Age of Onset  . CAD Mother   . Cancer Sister   . Hypertension Sister    Past Surgical History:  Procedure Laterality Date  . ABDOMINAL HYSTERECTOMY    . CHOLECYSTECTOMY    . EYE SURGERY     Patient Active Problem List   Diagnosis Date Noted  . Hypokalemia 02/29/2016  . Generalized abdominal pain   . Pancreatic mass   . Palliative care by specialist   . Goals of care, counseling/discussion   . Chronic renal insufficiency 10/27/2014  . Hyponatremia 10/27/2014  . Leukocytosis 10/27/2014  . Left shoulder pain 10/27/2014  . Atypical chest pain 10/25/2014  . Abnormal EKG 10/25/2014  . Chest pain 10/25/2014  . General weakness  05/25/2014  . Elevated troponin 05/25/2014  . CAD (coronary artery disease) 05/25/2014  . HTN (hypertension) 05/25/2014  . DM (diabetes mellitus) ([place]) 05/25/2014  . COPD (chronic obstructive pulmonary disease) ([place]) 05/25/2014  . CKD (chronic kidney disease) 05/25/2014  . Chronic anemia 05/25/2014      Prior to Admission medications   Medication Sig Start Date End Date Taking? Authorizing Provider  acetaminophen (TYLENOL) 325 MG tablet Take 325 mg by mouth every 6 (six) hours as needed for mild pain, moderate pain or fever.    [provider]  aspirin EC 81 MG tablet Take 1 tablet (81 mg total) by mouth daily. Patient not taking: Reported on 02/29/2016 10/27/14   Theodoro Grist, MD  atorvastatin (LIPITOR) 20 MG tablet Take 1 tablet (20 mg total) by mouth daily. Patient not taking: Reported on 03/31/2016 10/27/14   Theodoro Grist, MD  Cholecalciferol (VITAMIN D3) 1000 UNITS CAPS Take 1,000 Units by mouth daily.    [provider]  ferrous sulfate 325 (65 FE) MG tablet Take 325 mg by mouth daily with breakfast.    [provider]  Fluticasone-Salmeterol (ADVAIR) 250-50 MCG/DOSE AEPB Inhale 1 puff into the lungs 2 (two) times daily.    [provider]  isosorbide mononitrate (IMDUR) 30 MG 24 hr tablet Take 1 tablet (30 mg total) by mouth daily. 10/27/14   Theodoro Grist, MD  lovastatin (MEVACOR) 20 MG tablet Take  20 mg by mouth at bedtime.    [provider]  megestrol (MEGACE) 40 MG tablet Take 1 tablet (40 mg total) by mouth daily. 03/02/16   Vaughan Basta, MD  metoprolol succinate (TOPROL-XL) 25 MG 24 hr tablet Take 25 mg by mouth daily.    [provider]  Pancrelipase, Lip-Prot-Amyl, (CREON) 24000-76000 units CPEP Take 1 capsule by mouth 2 (two) times daily. 01/31/16   [provider]  pantoprazole (PROTONIX) 40 MG tablet Take 40 mg by mouth daily.    [provider]  potassium chloride (KLOR-CON) 20 MEQ  packet Take 20 mEq by mouth 2 (two) times daily. 03/01/16   Vaughan Basta, MD  sitaGLIPtin (JANUVIA) 50 MG tablet Take 50 mg by mouth daily.    [provider]  sucralfate (CARAFATE) 1 g tablet Take 1 tablet by mouth 4 (four) times daily.    [provider]  torsemide (DEMADEX) 10 MG tablet Take 10 mg by mouth 2 (two) times daily.     [provider]  traMADol (ULTRAM) 50 MG tablet Take 50 mg by mouth every 6 (six) hours as needed for pain.    [provider]  traZODone (DESYREL) 50 MG tablet Take 50 mg by mouth at bedtime.    [provider]    Allergies Biaxin [clarithromycin] and Boniva [ibandronic acid]    Social History Social History  Substance Use Topics  . Smoking status: Never Smoker  . Smokeless tobacco: Current User    Types: Snuff  . Alcohol use No    Review of Systems Patient denies headaches, rhinorrhea, blurry vision, numbness, shortness of breath, chest pain, edema, cough, abdominal pain, nausea, vomiting, diarrhea, dysuria, fevers, rashes or hallucinations unless otherwise stated above in HPI. ____________________________________________   PHYSICAL EXAM:  VITAL SIGNS: Vitals:   05/30/16 2057 05/30/16 2136  BP: 138/75 (!) 149/73  Pulse: 69 62  Resp: 18 12  Temp: 98 F (36.7 C)     Constitutional: Alert and oriented. Chronically ill appearing in no acute distress. Eyes: Conjunctivae are normal. PERRL. EOMI. Head: Atraumatic. Nose: No congestion/rhinnorhea. Mouth/Throat: Mucous membranes are moist.  Oropharynx non-erythematous. Neck: No stridor. Painless ROM. No cervical spine tenderness to palpation Hematological/Lymphatic/Immunilogical: No cervical lymphadenopathy. Cardiovascular: Normal rate, regular rhythm. Grossly normal heart sounds.  Good peripheral circulation. Respiratory: Normal respiratory effort.  No retractions. Lungs CTAB. Gastrointestinal: Soft and nontender. No distention. No abdominal  bruits. No CVA tenderness. Musculoskeletal: No lower extremity tenderness nor edema.  No joint effusions. Neurologic:  Normal speech and language. No gross focal neurologic deficits are appreciated.  Strength is symetric bilaterally.  Diffusely weak but 2+ DTRs bilaterally Skin:  Skin is warm, dry and intact. No rash noted. Psychiatric: Mood and affect are normal. Speech and behavior are normal.  ____________________________________________   LABS (all labs ordered are listed, but only abnormal results are displayed)  Results for orders placed or performed during the hospital encounter of 05/30/16 (from the past 24 hour(s))  CBC     Status: Abnormal   Collection Time: 05/30/16  9:01 PM  Result Value Ref Range   WBC 7.9 3.6 - 11.0 K/uL   RBC 3.68 (L) 3.80 - 5.20 MIL/uL   Hemoglobin 11.2 (L) 12.0 - 16.0 g/dL   HCT 33.2 (L) 35.0 - 47.0 %   MCV 90.2 80.0 - 100.0 fL   MCH 30.3 26.0 - 34.0 pg   MCHC 33.6 32.0 - 36.0 g/dL   RDW 14.1 11.5 - 14.5 %  Platelets 279 150 - 440 K/uL  Troponin I     Status: None   Collection Time: 05/30/16  9:01 PM  Result Value Ref Range   Troponin I <0.03 <0.03 ng/mL  Basic metabolic panel     Status: Abnormal   Collection Time: 05/30/16  9:01 PM  Result Value Ref Range   Sodium 133 (L) 135 - 145 mmol/L   Potassium 3.2 (L) 3.5 - 5.1 mmol/L   Chloride 98 (L) 101 - 111 mmol/L   CO2 26 22 - 32 mmol/L   Glucose, Bld 346 (H) 65 - 99 mg/dL   BUN 12 6 - 20 mg/dL   Creatinine, Ser 1.43 (H) 0.44 - 1.00 mg/dL   Calcium 8.3 (L) 8.9 - 10.3 mg/dL   GFR calc non Af Amer 32 (L) >60 mL/min   GFR calc Af Amer 37 (L) >60 mL/min   Anion gap 9 5 - 15   ____________________________________________ ____________________________________________  RADIOLOGY  I personally reviewed all radiographic images ordered to evaluate for the above acute complaints and reviewed radiology reports and findings.  These findings were personally discussed with the patient.  Please see  medical record for radiology report.  ____________________________________________   PROCEDURES  Procedure(s) performed:  Procedures    Critical Care performed: no ____________________________________________   INITIAL IMPRESSION / ASSESSMENT AND PLAN / ED COURSE  Pertinent labs & imaging results that were available during my care of the patient were reviewed by me and considered in my medical decision making (see chart for details).  DDX: cva, electrolyte abn, esophagitis, cancer, mass  SANTA ABDELRAHMAN is a 83 y.o. who presents to the ED with multiple complaints as described above. Patient is chronically ill-appearing but in no acute distress.  Patient is AFVSS in ED. Exam as above. Given current presentation have considered the above differential.  CT imaging of the head and neck ordered to evaluate for dysphasia and weakness. Does not appear to have any objective lateralizing weakness. No evidence of oropharyngeal abnormality and no appreciable masses but patient does have known pancreatic cancer for which she is under hospice care. We'll check blood work to evaluate for any evidence of significantly elevated glucose or dehydration.  Clinical Course as of May 30 2352  Tue May 30, 2016  2301 Results discussed with patient and family. I do suspect that some component of her weakness is secondary to radiculopathy and possible cord compression. I discussed my concerns with the patient and family and recommended MRI. Patient states that she is palliative care and is aware of her chronic medical additions does not want further diagnostic testing performed at this time.  [PR]    Clinical Course User Index [PR] Merlyn Lot, MD    ----------------------------------------- 11:58 PM on 05/30/2016 -----------------------------------------  Patient passed a swallow screen with no complications. Remains stable in no acute distress. Based on her goals of care do not feel that further  diagnostic testing is clinically indicated at this time and discussed my thoughts with patient and family and they agree with the plan for discharge home with follow-up. She does have home hospice visiting her daily. Urinalysis does show rare bacteria. May be contaminant but given positive leukocytes will start patient on antibiotics.  Have discussed with the patient and available family all diagnostics and treatments performed thus far and all questions were answered to the best of my ability. The patient demonstrates understanding and agreement with plan.  ____________________________________________   FINAL CLINICAL IMPRESSION(S) / ED DIAGNOSES  Final diagnoses:  Neck pain  Weakness  Dysphagia, unspecified type      NEW MEDICATIONS STARTED DURING THIS VISIT:  New Prescriptions   No medications on file     Note:  This document was prepared using Dragon voice recognition software and may include unintentional dictation errors.    Merlyn Lot, MD 05/30/16 [phone]

## 2016-05-30 NOTE — ED Triage Notes (Signed)
Pt to triage via w/c with no distress noted; pt reports " my neck is drawing", st feels like her throat is closing, and having hard time swallowing and head feels big"; pt denies any actual  Pain however, st weakness to LE; family reports pt with pancreatic CA, but no treatment, has had recent hyperglycemia; pt is A&Ox3 but very vague with describing her symptoms

## 2016-06-01 DIAGNOSIS — E1122 Type 2 diabetes mellitus with diabetic chronic kidney disease: Secondary | ICD-10-CM | POA: Diagnosis not present

## 2016-06-01 DIAGNOSIS — R739 Hyperglycemia, unspecified: Secondary | ICD-10-CM | POA: Diagnosis not present

## 2016-06-01 DIAGNOSIS — R600 Localized edema: Secondary | ICD-10-CM | POA: Diagnosis not present

## 2016-06-01 DIAGNOSIS — C259 Malignant neoplasm of pancreas, unspecified: Secondary | ICD-10-CM | POA: Diagnosis not present

## 2016-06-01 DIAGNOSIS — E1165 Type 2 diabetes mellitus with hyperglycemia: Secondary | ICD-10-CM | POA: Diagnosis not present

## 2016-06-01 DIAGNOSIS — N183 Chronic kidney disease, stage 3 (moderate): Secondary | ICD-10-CM | POA: Diagnosis not present

## 2016-06-12 DIAGNOSIS — R801 Persistent proteinuria, unspecified: Secondary | ICD-10-CM | POA: Diagnosis not present

## 2016-06-12 DIAGNOSIS — R809 Proteinuria, unspecified: Secondary | ICD-10-CM | POA: Diagnosis not present

## 2016-06-12 DIAGNOSIS — D631 Anemia in chronic kidney disease: Secondary | ICD-10-CM | POA: Diagnosis not present

## 2016-06-12 DIAGNOSIS — N390 Urinary tract infection, site not specified: Secondary | ICD-10-CM | POA: Diagnosis not present

## 2016-06-12 DIAGNOSIS — N183 Chronic kidney disease, stage 3 (moderate): Secondary | ICD-10-CM | POA: Diagnosis not present

## 2016-06-12 DIAGNOSIS — E875 Hyperkalemia: Secondary | ICD-10-CM | POA: Diagnosis not present

## 2016-06-12 DIAGNOSIS — E1122 Type 2 diabetes mellitus with diabetic chronic kidney disease: Secondary | ICD-10-CM | POA: Diagnosis not present

## 2016-06-12 DIAGNOSIS — I129 Hypertensive chronic kidney disease with stage 1 through stage 4 chronic kidney disease, or unspecified chronic kidney disease: Secondary | ICD-10-CM | POA: Diagnosis not present

## 2016-06-22 DIAGNOSIS — K219 Gastro-esophageal reflux disease without esophagitis: Secondary | ICD-10-CM | POA: Diagnosis not present

## 2016-06-22 DIAGNOSIS — E1165 Type 2 diabetes mellitus with hyperglycemia: Secondary | ICD-10-CM | POA: Diagnosis not present

## 2016-06-22 DIAGNOSIS — F5101 Primary insomnia: Secondary | ICD-10-CM | POA: Diagnosis not present

## 2016-06-22 DIAGNOSIS — E1122 Type 2 diabetes mellitus with diabetic chronic kidney disease: Secondary | ICD-10-CM | POA: Diagnosis not present

## 2016-06-22 DIAGNOSIS — J449 Chronic obstructive pulmonary disease, unspecified: Secondary | ICD-10-CM | POA: Diagnosis not present

## 2016-06-22 DIAGNOSIS — D490 Neoplasm of unspecified behavior of digestive system: Secondary | ICD-10-CM | POA: Diagnosis not present

## 2016-06-22 DIAGNOSIS — E7800 Pure hypercholesterolemia, unspecified: Secondary | ICD-10-CM | POA: Diagnosis not present

## 2016-06-22 DIAGNOSIS — N183 Chronic kidney disease, stage 3 (moderate): Secondary | ICD-10-CM | POA: Diagnosis not present

## 2016-06-22 DIAGNOSIS — I10 Essential (primary) hypertension: Secondary | ICD-10-CM | POA: Diagnosis not present

## 2016-06-28 DIAGNOSIS — E538 Deficiency of other specified B group vitamins: Secondary | ICD-10-CM | POA: Diagnosis not present

## 2016-07-28 DIAGNOSIS — E538 Deficiency of other specified B group vitamins: Secondary | ICD-10-CM | POA: Diagnosis not present

## 2016-08-11 DIAGNOSIS — E089 Diabetes mellitus due to underlying condition without complications: Secondary | ICD-10-CM | POA: Diagnosis not present

## 2016-08-11 DIAGNOSIS — G453 Amaurosis fugax: Secondary | ICD-10-CM | POA: Diagnosis not present

## 2016-08-11 DIAGNOSIS — E119 Type 2 diabetes mellitus without complications: Secondary | ICD-10-CM | POA: Diagnosis not present

## 2016-08-11 DIAGNOSIS — H43813 Vitreous degeneration, bilateral: Secondary | ICD-10-CM | POA: Diagnosis not present

## 2016-08-28 DIAGNOSIS — E538 Deficiency of other specified B group vitamins: Secondary | ICD-10-CM | POA: Diagnosis not present

## 2016-09-22 ENCOUNTER — Other Ambulatory Visit: Payer: Self-pay | Admitting: Family Medicine

## 2016-09-22 ENCOUNTER — Ambulatory Visit
Admission: RE | Admit: 2016-09-22 | Discharge: 2016-09-22 | Disposition: A | Discharge status: Home / Self Care | Source: Ambulatory Visit | Attending: Family Medicine | Admitting: Family Medicine

## 2016-09-22 DIAGNOSIS — M79662 Pain in left lower leg: Secondary | ICD-10-CM

## 2016-09-22 DIAGNOSIS — E7800 Pure hypercholesterolemia, unspecified: Secondary | ICD-10-CM | POA: Diagnosis not present

## 2016-09-22 DIAGNOSIS — J449 Chronic obstructive pulmonary disease, unspecified: Secondary | ICD-10-CM | POA: Diagnosis not present

## 2016-09-22 DIAGNOSIS — K219 Gastro-esophageal reflux disease without esophagitis: Secondary | ICD-10-CM | POA: Diagnosis not present

## 2016-09-22 DIAGNOSIS — E1122 Type 2 diabetes mellitus with diabetic chronic kidney disease: Secondary | ICD-10-CM | POA: Diagnosis not present

## 2016-09-22 DIAGNOSIS — I10 Essential (primary) hypertension: Secondary | ICD-10-CM | POA: Diagnosis not present

## 2016-09-22 DIAGNOSIS — E1165 Type 2 diabetes mellitus with hyperglycemia: Secondary | ICD-10-CM | POA: Diagnosis not present

## 2016-09-22 DIAGNOSIS — C259 Malignant neoplasm of pancreas, unspecified: Secondary | ICD-10-CM | POA: Diagnosis not present

## 2016-09-22 DIAGNOSIS — F5101 Primary insomnia: Secondary | ICD-10-CM | POA: Diagnosis not present

## 2016-09-28 DIAGNOSIS — E538 Deficiency of other specified B group vitamins: Secondary | ICD-10-CM | POA: Diagnosis not present

## 2016-10-17 DIAGNOSIS — D631 Anemia in chronic kidney disease: Secondary | ICD-10-CM | POA: Diagnosis not present

## 2016-10-17 DIAGNOSIS — N183 Chronic kidney disease, stage 3 (moderate): Secondary | ICD-10-CM | POA: Diagnosis not present

## 2016-10-17 DIAGNOSIS — I10 Essential (primary) hypertension: Secondary | ICD-10-CM | POA: Diagnosis not present

## 2016-10-17 DIAGNOSIS — E1122 Type 2 diabetes mellitus with diabetic chronic kidney disease: Secondary | ICD-10-CM | POA: Diagnosis not present

## 2016-10-17 DIAGNOSIS — N2581 Secondary hyperparathyroidism of renal origin: Secondary | ICD-10-CM | POA: Diagnosis not present

## 2016-10-17 DIAGNOSIS — I129 Hypertensive chronic kidney disease with stage 1 through stage 4 chronic kidney disease, or unspecified chronic kidney disease: Secondary | ICD-10-CM | POA: Diagnosis not present

## 2016-10-17 DIAGNOSIS — E8809 Other disorders of plasma-protein metabolism, not elsewhere classified: Secondary | ICD-10-CM | POA: Diagnosis not present

## 2016-10-30 DIAGNOSIS — E538 Deficiency of other specified B group vitamins: Secondary | ICD-10-CM | POA: Diagnosis not present

## 2016-12-22 ENCOUNTER — Other Ambulatory Visit: Payer: Self-pay | Admitting: Family Medicine

## 2016-12-22 DIAGNOSIS — J449 Chronic obstructive pulmonary disease, unspecified: Secondary | ICD-10-CM | POA: Diagnosis not present

## 2016-12-22 DIAGNOSIS — E7800 Pure hypercholesterolemia, unspecified: Secondary | ICD-10-CM | POA: Diagnosis not present

## 2016-12-22 DIAGNOSIS — E1165 Type 2 diabetes mellitus with hyperglycemia: Secondary | ICD-10-CM | POA: Diagnosis not present

## 2016-12-22 DIAGNOSIS — C259 Malignant neoplasm of pancreas, unspecified: Secondary | ICD-10-CM

## 2016-12-22 DIAGNOSIS — N183 Chronic kidney disease, stage 3 (moderate): Secondary | ICD-10-CM | POA: Diagnosis not present

## 2016-12-22 DIAGNOSIS — I10 Essential (primary) hypertension: Secondary | ICD-10-CM | POA: Diagnosis not present

## 2016-12-22 DIAGNOSIS — K219 Gastro-esophageal reflux disease without esophagitis: Secondary | ICD-10-CM | POA: Diagnosis not present

## 2016-12-22 DIAGNOSIS — G8929 Other chronic pain: Secondary | ICD-10-CM

## 2016-12-22 DIAGNOSIS — F331 Major depressive disorder, recurrent, moderate: Secondary | ICD-10-CM | POA: Diagnosis not present

## 2016-12-22 DIAGNOSIS — E1122 Type 2 diabetes mellitus with diabetic chronic kidney disease: Secondary | ICD-10-CM | POA: Diagnosis not present

## 2016-12-22 DIAGNOSIS — R51 Headache: Secondary | ICD-10-CM

## 2016-12-30 ENCOUNTER — Ambulatory Visit
Admission: RE | Admit: 2016-12-30 | Discharge: 2016-12-30 | Disposition: A | Discharge status: Home / Self Care | Payer: Medicare Other | Source: Ambulatory Visit | Attending: Family Medicine | Admitting: Family Medicine

## 2016-12-30 DIAGNOSIS — G319 Degenerative disease of nervous system, unspecified: Secondary | ICD-10-CM | POA: Diagnosis not present

## 2016-12-30 DIAGNOSIS — C259 Malignant neoplasm of pancreas, unspecified: Secondary | ICD-10-CM | POA: Insufficient documentation

## 2016-12-30 DIAGNOSIS — G8929 Other chronic pain: Secondary | ICD-10-CM

## 2016-12-30 DIAGNOSIS — I739 Peripheral vascular disease, unspecified: Secondary | ICD-10-CM | POA: Diagnosis not present

## 2016-12-30 DIAGNOSIS — R51 Headache: Secondary | ICD-10-CM | POA: Diagnosis not present

## 2017-01-23 DIAGNOSIS — E1122 Type 2 diabetes mellitus with diabetic chronic kidney disease: Secondary | ICD-10-CM | POA: Diagnosis not present

## 2017-01-23 DIAGNOSIS — I129 Hypertensive chronic kidney disease with stage 1 through stage 4 chronic kidney disease, or unspecified chronic kidney disease: Secondary | ICD-10-CM | POA: Diagnosis not present

## 2017-01-23 DIAGNOSIS — C259 Malignant neoplasm of pancreas, unspecified: Secondary | ICD-10-CM | POA: Diagnosis not present

## 2017-01-23 DIAGNOSIS — J449 Chronic obstructive pulmonary disease, unspecified: Secondary | ICD-10-CM | POA: Diagnosis not present

## 2017-04-13 DIAGNOSIS — E1165 Type 2 diabetes mellitus with hyperglycemia: Secondary | ICD-10-CM | POA: Diagnosis not present

## 2017-04-13 DIAGNOSIS — E1122 Type 2 diabetes mellitus with diabetic chronic kidney disease: Secondary | ICD-10-CM | POA: Diagnosis not present

## 2017-04-13 DIAGNOSIS — I10 Essential (primary) hypertension: Secondary | ICD-10-CM | POA: Diagnosis not present

## 2017-04-13 DIAGNOSIS — Z0000 Encounter for general adult medical examination without abnormal findings: Secondary | ICD-10-CM | POA: Diagnosis not present

## 2017-04-13 DIAGNOSIS — C259 Malignant neoplasm of pancreas, unspecified: Secondary | ICD-10-CM | POA: Diagnosis not present

## 2017-04-13 DIAGNOSIS — N183 Chronic kidney disease, stage 3 (moderate): Secondary | ICD-10-CM | POA: Diagnosis not present

## 2017-04-13 DIAGNOSIS — N184 Chronic kidney disease, stage 4 (severe): Secondary | ICD-10-CM | POA: Diagnosis not present

## 2017-04-13 DIAGNOSIS — F331 Major depressive disorder, recurrent, moderate: Secondary | ICD-10-CM | POA: Diagnosis not present

## 2017-04-13 DIAGNOSIS — J449 Chronic obstructive pulmonary disease, unspecified: Secondary | ICD-10-CM | POA: Diagnosis not present

## 2017-04-13 DIAGNOSIS — E7800 Pure hypercholesterolemia, unspecified: Secondary | ICD-10-CM | POA: Diagnosis not present

## 2017-04-13 DIAGNOSIS — K219 Gastro-esophageal reflux disease without esophagitis: Secondary | ICD-10-CM | POA: Diagnosis not present

## 2017-05-01 DIAGNOSIS — R7989 Other specified abnormal findings of blood chemistry: Secondary | ICD-10-CM | POA: Diagnosis not present

## 2017-05-22 DIAGNOSIS — I129 Hypertensive chronic kidney disease with stage 1 through stage 4 chronic kidney disease, or unspecified chronic kidney disease: Secondary | ICD-10-CM | POA: Diagnosis not present

## 2017-05-22 DIAGNOSIS — E1122 Type 2 diabetes mellitus with diabetic chronic kidney disease: Secondary | ICD-10-CM | POA: Diagnosis not present

## 2017-05-22 DIAGNOSIS — C259 Malignant neoplasm of pancreas, unspecified: Secondary | ICD-10-CM | POA: Diagnosis not present

## 2017-05-22 DIAGNOSIS — J449 Chronic obstructive pulmonary disease, unspecified: Secondary | ICD-10-CM | POA: Diagnosis not present

## 2017-07-09 DEATH — deceased

## 2017-10-09 IMAGING — CR DG CHEST 2V
2 series · 2 of 2 positions shown · non-contrast
Comparison: February 28, 2016

CLINICAL DATA: Shortness of breath and lower extremity edema

EXAM:
CHEST  2 VIEW

[chest pa]
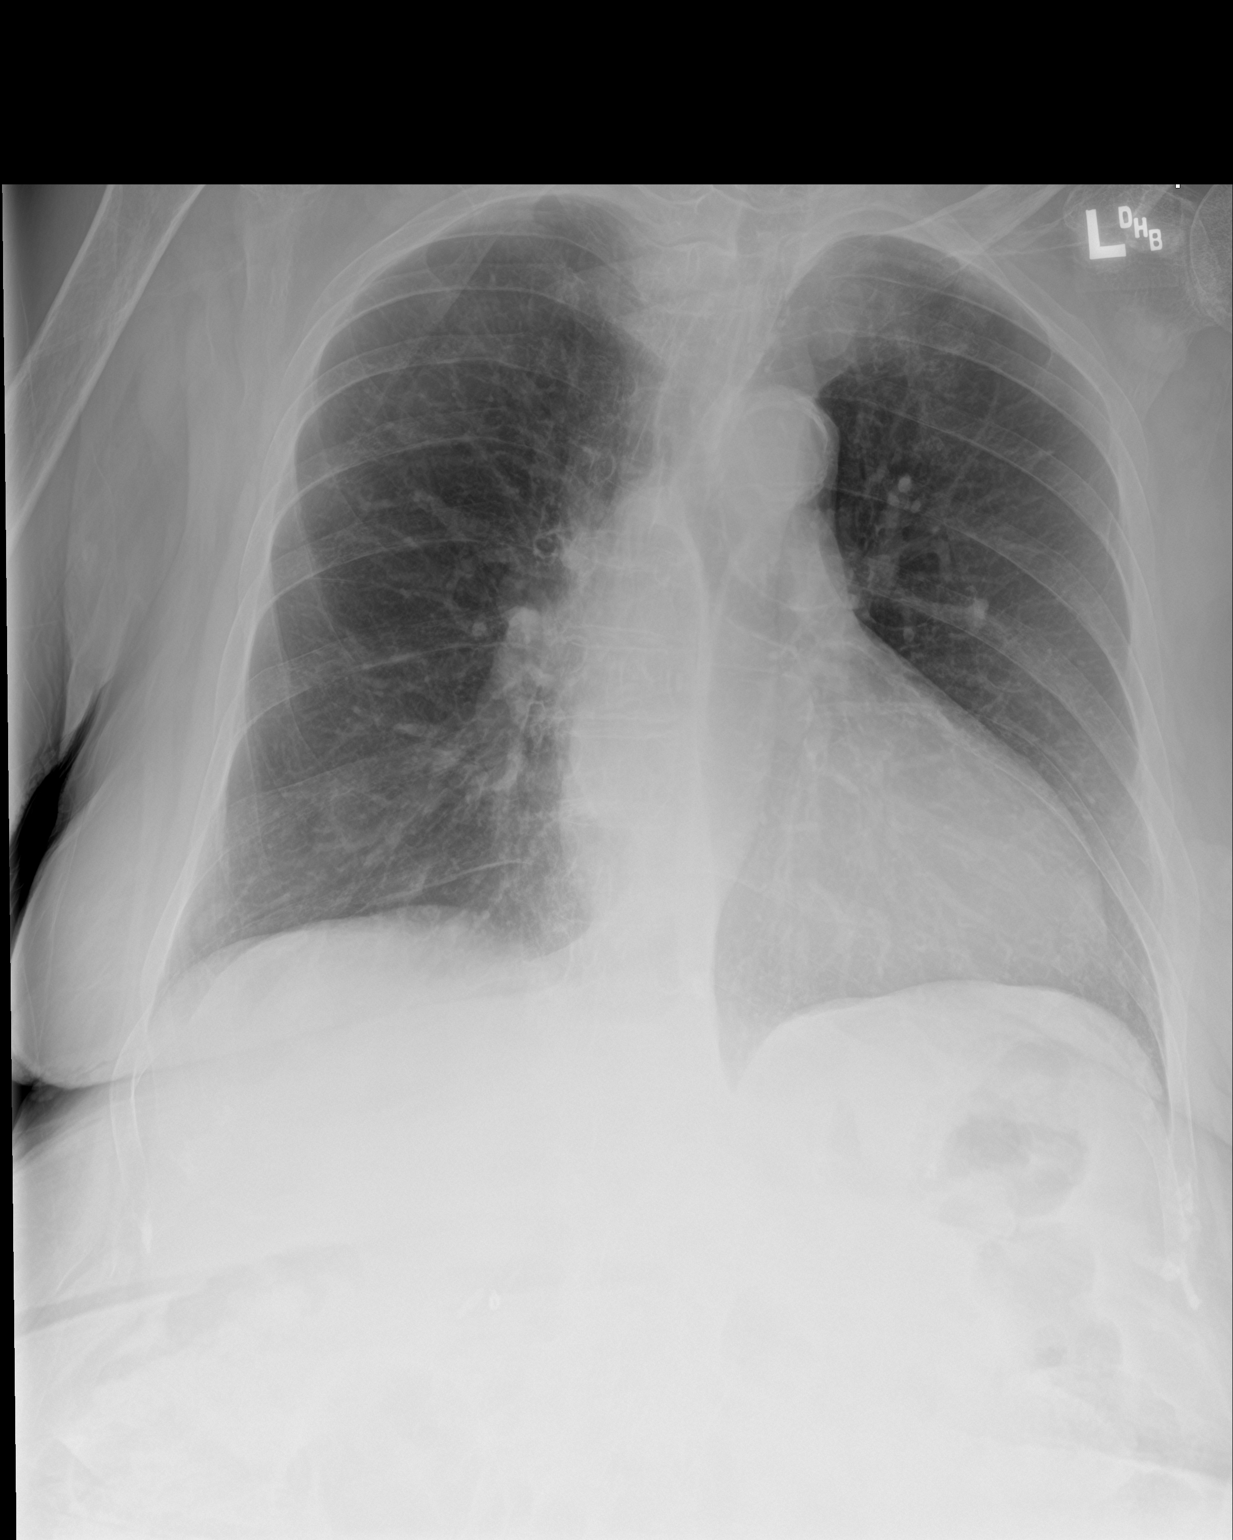

[chest lat]
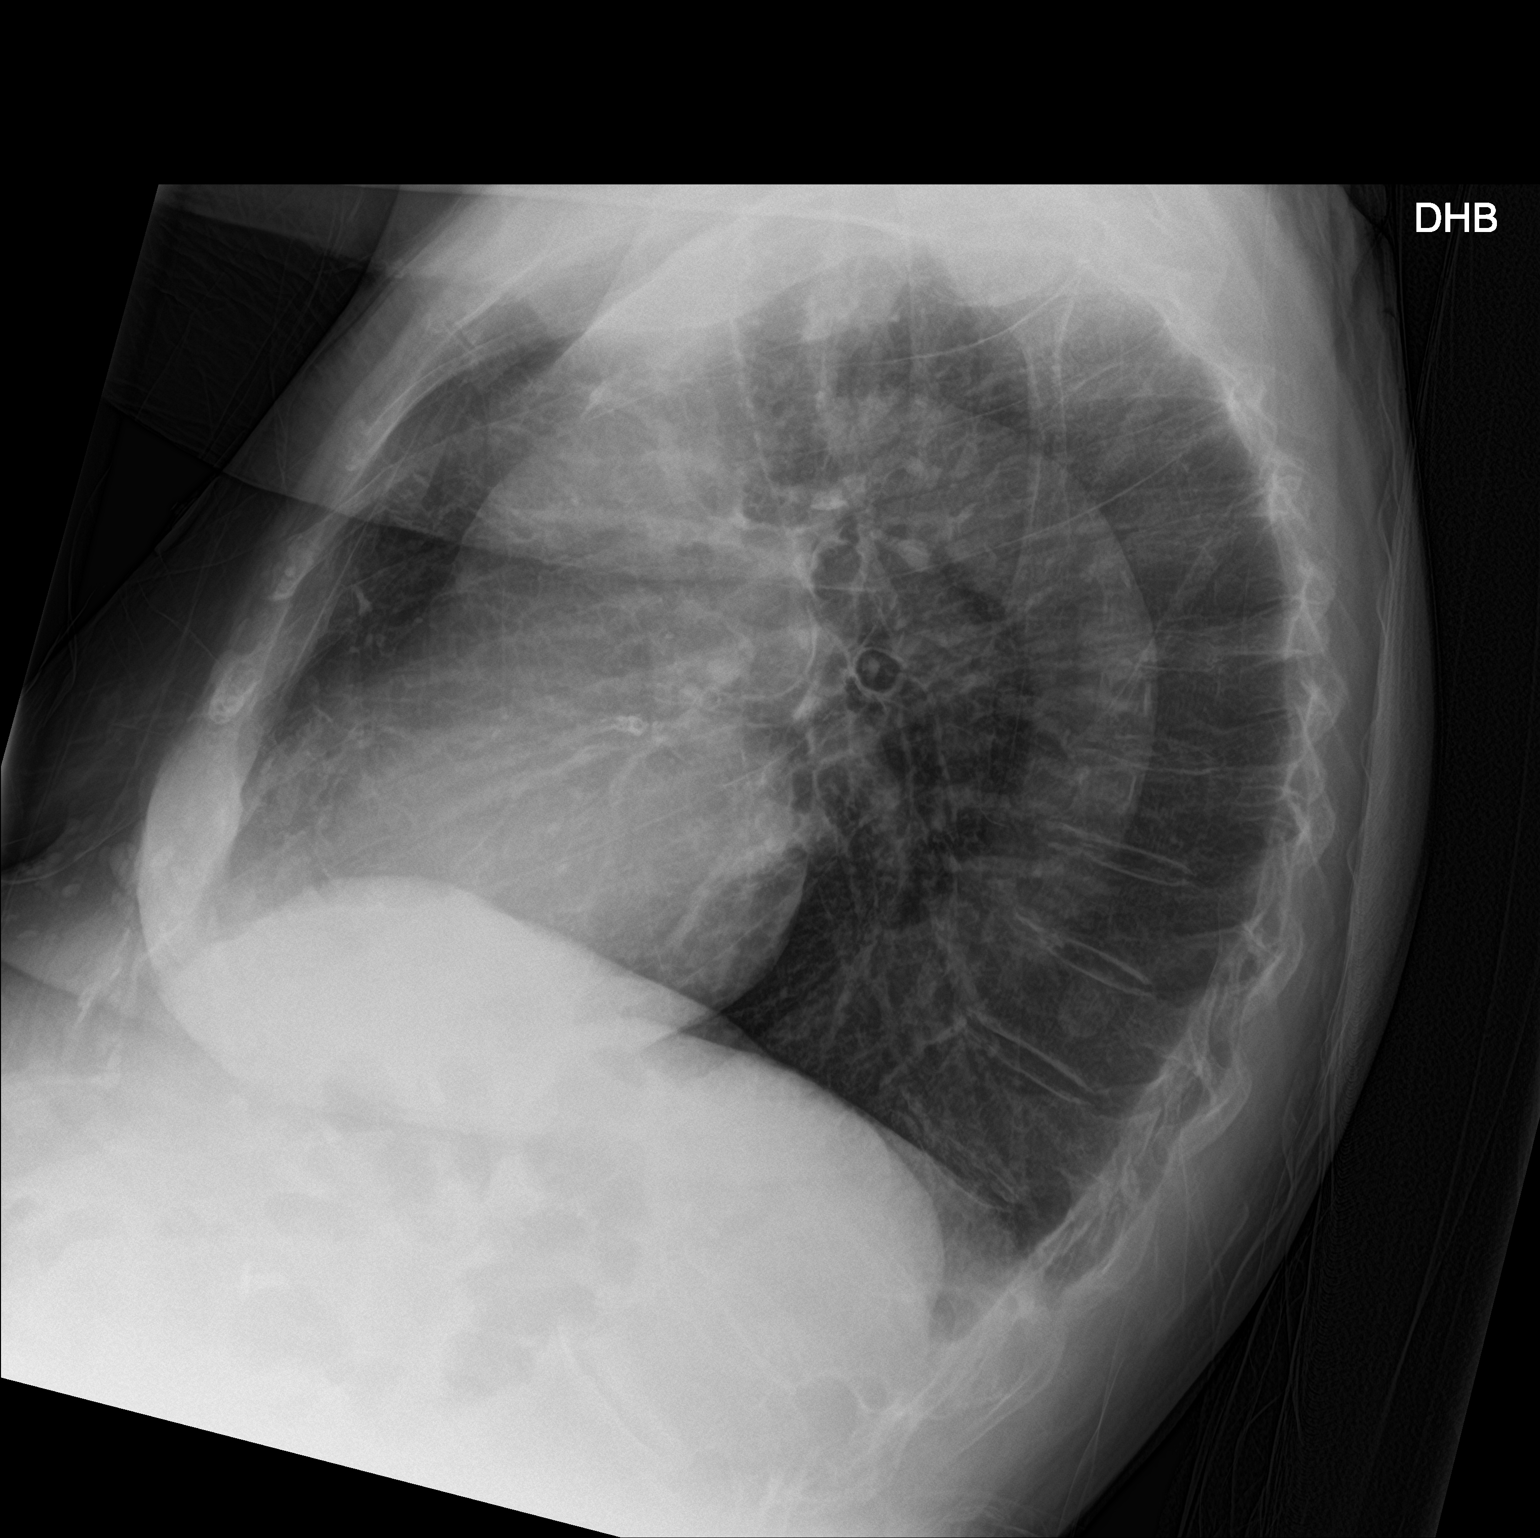

[2 of 2 positions shown; findings below may reference images not displayed]

FINDINGS: There is no edema or consolidation. Heart is slightly enlarged with
pulmonary vascularity within normal limits. There is calcification
in the left anterior descending coronary artery. There is also
aortic atherosclerotic calcification. No adenopathy. No evident bone
lesions.
IMPRESSION: Slight cardiac enlargement. No edema or consolidation. There is
aortic atherosclerosis as well as calcification in the left anterior
descending coronary artery.

## 2017-12-10 IMAGING — US US EXTREM LOW VENOUS*L*
1 series · 13 of 24 positions shown · non-contrast
Comparison: 04/12/2016

CLINICAL DATA: LEFT lower leg pain beginning overnight, history
asthma, hypertension, coronary artery disease, COPD, diabetes
mellitus, pancreatic cancer



[Series 1: us extrem low venous*left* · 0.08mm/px · 13 of 42 slices shown]
[im 1/42]
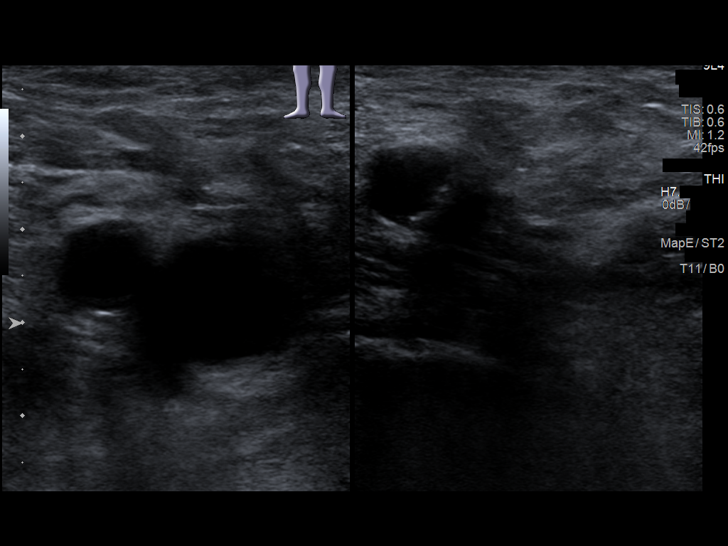
[im 4/42]
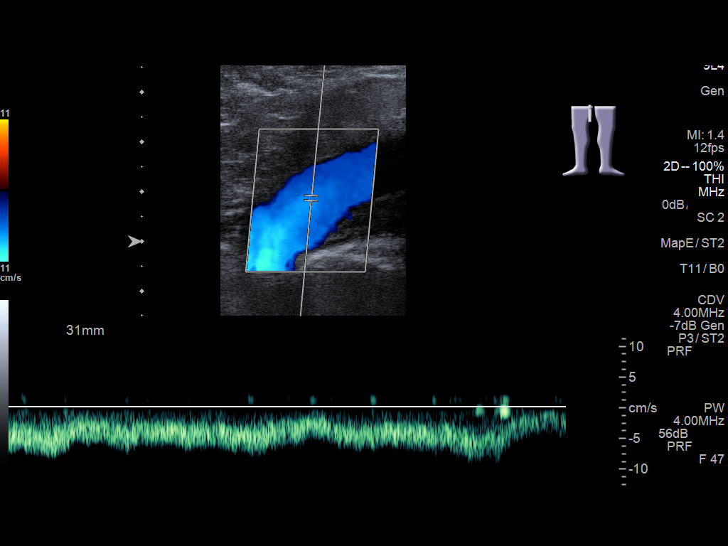
[im 8/42]
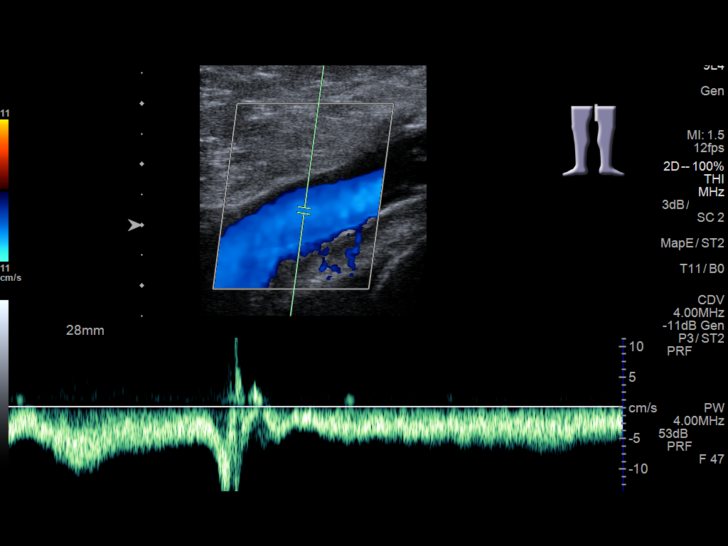
[im 11/42]
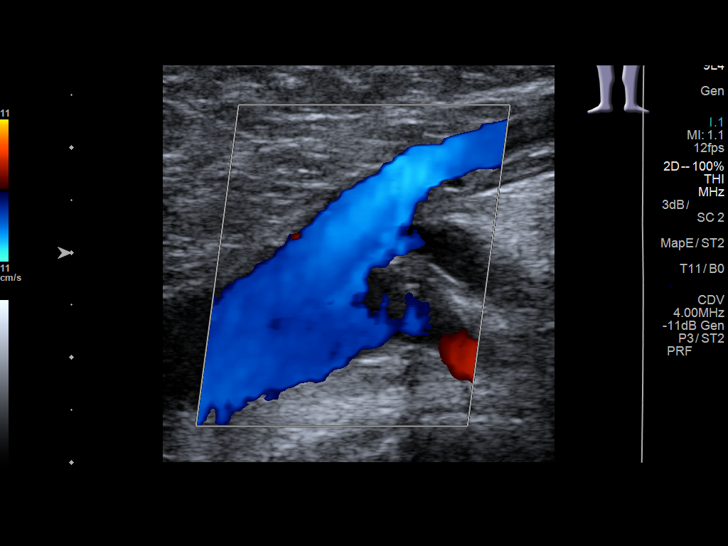
[im 15/42]
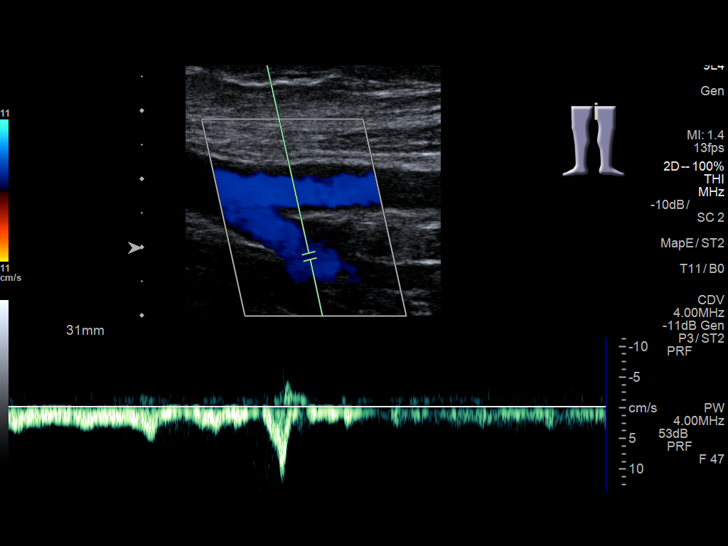
[im 18/42]
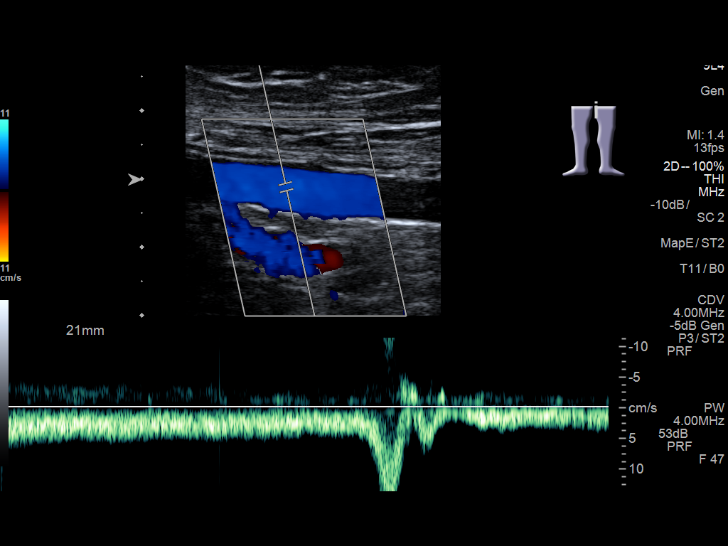
[im 22/42]
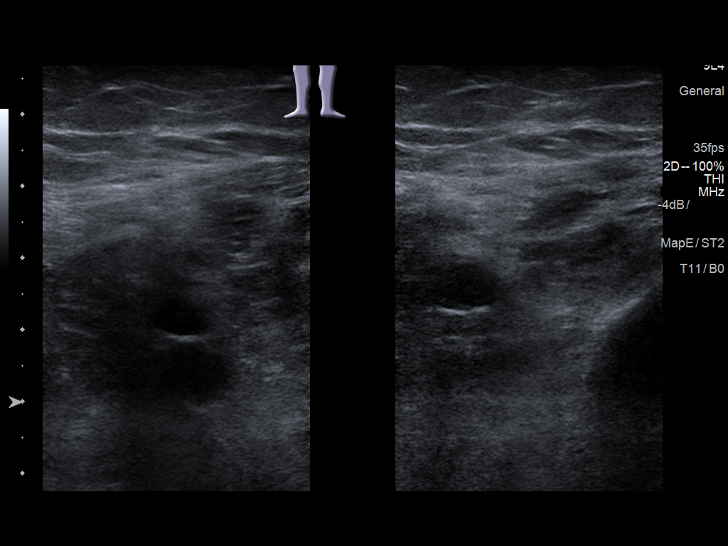
[im 24/42]
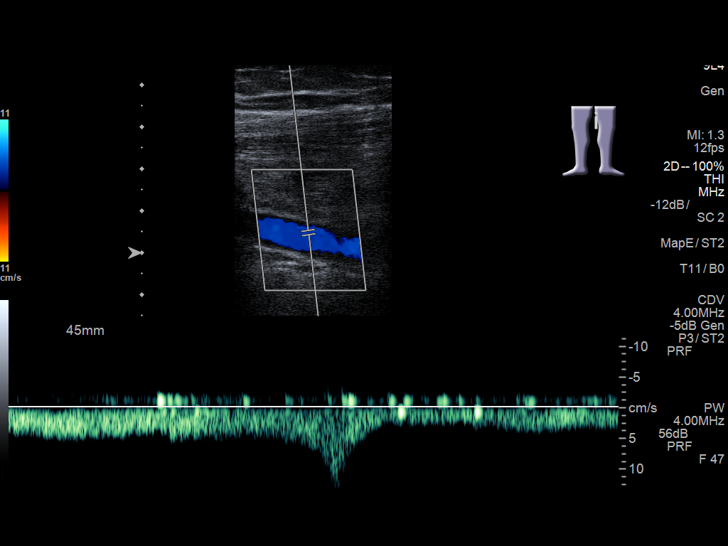
[im 27/42]
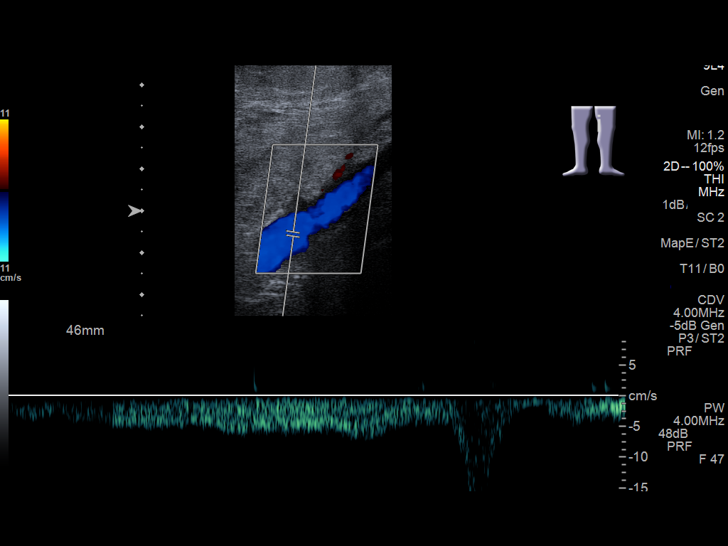
[im 31/42]
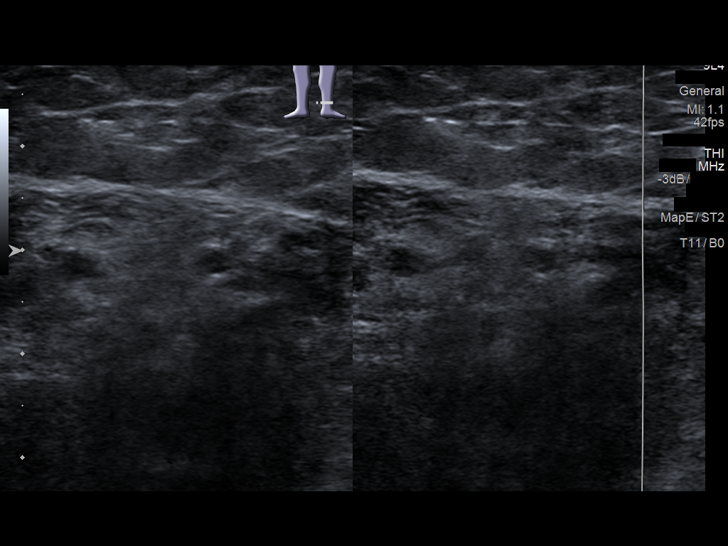
[im 34/42]
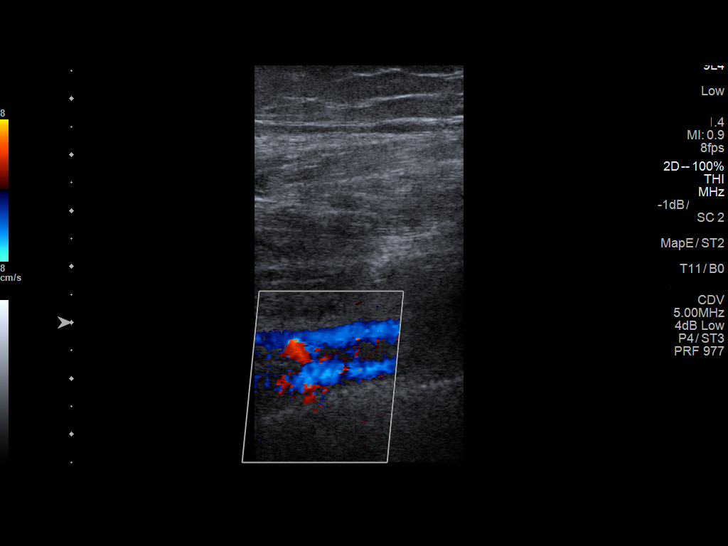
[im 38/42]
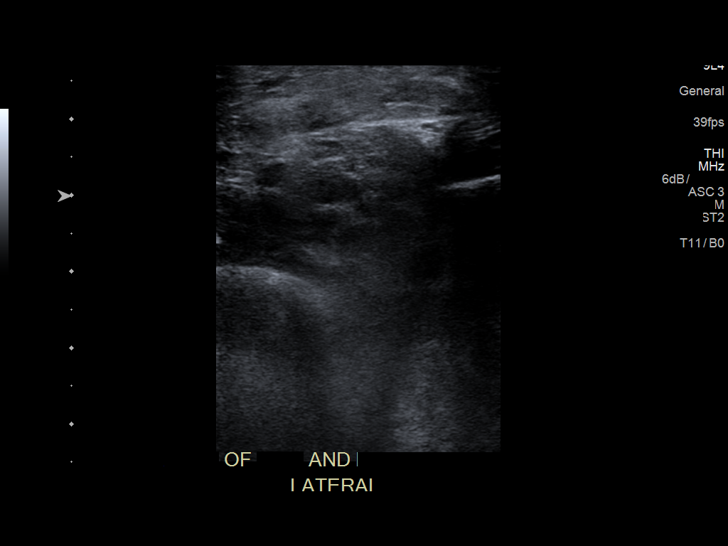
[im 42/42]
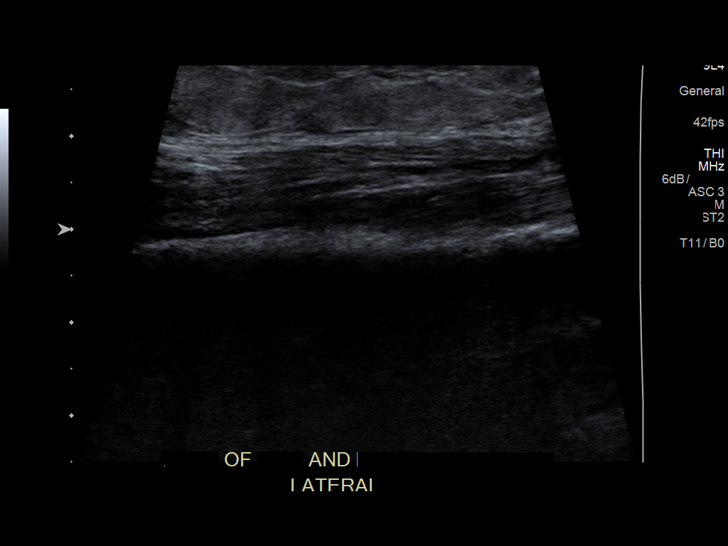

[13 of 24 positions shown; findings below may reference images not displayed]

FINDINGS: Contralateral Common Femoral Vein: Respiratory phasicity is normal
and symmetric with the symptomatic side. No evidence of thrombus.
Normal compressibility.

Common Femoral Vein: No evidence of thrombus. Normal
compressibility, respiratory phasicity and response to augmentation.

Saphenofemoral Junction: No evidence of thrombus. Normal
compressibility and flow on color Doppler imaging.

Profunda Femoral Vein: No evidence of thrombus. Normal
compressibility and flow on color Doppler imaging.

Femoral Vein: No evidence of thrombus. Normal compressibility,
respiratory phasicity and response to augmentation.

Popliteal Vein: No evidence of thrombus. Normal compressibility,
respiratory phasicity and response to augmentation.

Calf Veins: No evidence of thrombus. Normal compressibility and flow
on color Doppler imaging.

Superficial Great Saphenous Vein: No evidence of thrombus. Normal
compressibility and flow on color Doppler imaging.

Venous Reflux:  None.

Other Findings: None. Specifically, no sonographic abnormalities are
identified at the area of pain, bruising and redness at the lateral
LEFT calf.
IMPRESSION: No evidence of DVT within the LEFT lower extremity.
